# Patient Record
Sex: Female | Born: 1940
Health system: Southern US, Community
[De-identification: ages and names within clinical notes are randomized; demographics above are authoritative.]

## PROBLEM LIST (undated history)

## (undated) ENCOUNTER — Ambulatory Visit (HOSPITAL_COMMUNITY): Discharge status: Absent without leave | Payer: PPO

## (undated) DIAGNOSIS — M1990 Unspecified osteoarthritis, unspecified site: Secondary | ICD-10-CM

## (undated) DIAGNOSIS — M549 Dorsalgia, unspecified: Secondary | ICD-10-CM

## (undated) DIAGNOSIS — F419 Anxiety disorder, unspecified: Secondary | ICD-10-CM

## (undated) DIAGNOSIS — F32A Depression, unspecified: Secondary | ICD-10-CM

## (undated) DIAGNOSIS — H269 Unspecified cataract: Secondary | ICD-10-CM

## (undated) DIAGNOSIS — R51 Headache: Secondary | ICD-10-CM

## (undated) DIAGNOSIS — K219 Gastro-esophageal reflux disease without esophagitis: Secondary | ICD-10-CM

## (undated) DIAGNOSIS — Z8489 Family history of other specified conditions: Secondary | ICD-10-CM

## (undated) DIAGNOSIS — M5481 Occipital neuralgia: Secondary | ICD-10-CM

## (undated) DIAGNOSIS — M8580 Other specified disorders of bone density and structure, unspecified site: Secondary | ICD-10-CM

## (undated) DIAGNOSIS — J309 Allergic rhinitis, unspecified: Secondary | ICD-10-CM

## (undated) DIAGNOSIS — D369 Benign neoplasm, unspecified site: Secondary | ICD-10-CM

## (undated) DIAGNOSIS — F329 Major depressive disorder, single episode, unspecified: Secondary | ICD-10-CM

## (undated) DIAGNOSIS — K8020 Calculus of gallbladder without cholecystitis without obstruction: Secondary | ICD-10-CM

## (undated) DIAGNOSIS — R112 Nausea with vomiting, unspecified: Secondary | ICD-10-CM

## (undated) DIAGNOSIS — K449 Diaphragmatic hernia without obstruction or gangrene: Secondary | ICD-10-CM

## (undated) DIAGNOSIS — G8929 Other chronic pain: Secondary | ICD-10-CM

## (undated) DIAGNOSIS — M797 Fibromyalgia: Secondary | ICD-10-CM

## (undated) DIAGNOSIS — Z9889 Other specified postprocedural states: Secondary | ICD-10-CM

## (undated) DIAGNOSIS — B3781 Candidal esophagitis: Secondary | ICD-10-CM

## (undated) HISTORY — DX: Diaphragmatic hernia without obstruction or gangrene: K44.9

## (undated) HISTORY — PX: CERVICAL SPINE SURGERY: SHX589

## (undated) HISTORY — DX: Unspecified cataract: H26.9

## (undated) HISTORY — PX: ABDOMINAL HYSTERECTOMY: SHX81

## (undated) HISTORY — PX: TEMPORAL ARTERY BIOPSY / LIGATION: SUR132

## (undated) HISTORY — DX: Allergic rhinitis, unspecified: J30.9

## (undated) HISTORY — DX: Other specified disorders of bone density and structure, unspecified site: M85.80

## (undated) HISTORY — DX: Other chronic pain: G89.29

## (undated) HISTORY — DX: Dorsalgia, unspecified: M54.9

## (undated) HISTORY — DX: Occipital neuralgia: M54.81

## (undated) HISTORY — PX: TUBAL LIGATION: SHX77

## (undated) HISTORY — PX: BREAST SURGERY: SHX581

## (undated) HISTORY — DX: Benign neoplasm, unspecified site: D36.9

## (undated) HISTORY — PX: EXCISIONAL HEMORRHOIDECTOMY: SHX1541

## (undated) HISTORY — PX: NECK SURGERY: SHX720

## (undated) HISTORY — PX: BREAST EXCISIONAL BIOPSY: SUR124

## (undated) HISTORY — PX: BACK SURGERY: SHX140

## (undated) HISTORY — PX: DILATION AND CURETTAGE OF UTERUS: SHX78

## (undated) HISTORY — DX: Candidal esophagitis: B37.81

## (undated) HISTORY — PX: OTHER SURGICAL HISTORY: SHX169

## (undated) HISTORY — DX: Calculus of gallbladder without cholecystitis without obstruction: K80.20

---

## 1999-01-19 ENCOUNTER — Ambulatory Visit (HOSPITAL_COMMUNITY): Admission: RE | Admit: 1999-01-19 | Discharge: 1999-01-19 | Payer: Self-pay | Admitting: Internal Medicine

## 1999-01-19 ENCOUNTER — Encounter: Payer: Self-pay | Admitting: Internal Medicine

## 1999-08-25 ENCOUNTER — Ambulatory Visit (HOSPITAL_BASED_OUTPATIENT_CLINIC_OR_DEPARTMENT_OTHER): Admission: RE | Admit: 1999-08-25 | Discharge: 1999-08-25 | Payer: Self-pay

## 1999-08-26 ENCOUNTER — Encounter (INDEPENDENT_AMBULATORY_CARE_PROVIDER_SITE_OTHER): Payer: Self-pay

## 2001-05-08 ENCOUNTER — Inpatient Hospital Stay (HOSPITAL_COMMUNITY): Admission: RE | Admit: 2001-05-08 | Discharge: 2001-05-10 | Payer: Self-pay | Admitting: Obstetrics and Gynecology

## 2001-09-12 ENCOUNTER — Encounter: Payer: Self-pay | Admitting: Obstetrics and Gynecology

## 2001-09-17 ENCOUNTER — Observation Stay (HOSPITAL_COMMUNITY): Admission: RE | Admit: 2001-09-17 | Discharge: 2001-09-18 | Payer: Self-pay | Admitting: Obstetrics and Gynecology

## 2001-09-17 ENCOUNTER — Encounter (INDEPENDENT_AMBULATORY_CARE_PROVIDER_SITE_OTHER): Payer: Self-pay

## 2001-10-03 ENCOUNTER — Ambulatory Visit (HOSPITAL_COMMUNITY): Admission: RE | Admit: 2001-10-03 | Discharge: 2001-10-03 | Payer: Self-pay | Admitting: Obstetrics and Gynecology

## 2001-10-07 ENCOUNTER — Encounter: Payer: Self-pay | Admitting: Obstetrics and Gynecology

## 2001-10-07 ENCOUNTER — Ambulatory Visit (HOSPITAL_COMMUNITY): Admission: RE | Admit: 2001-10-07 | Discharge: 2001-10-07 | Payer: Self-pay | Admitting: Obstetrics and Gynecology

## 2002-11-17 ENCOUNTER — Encounter: Admission: RE | Admit: 2002-11-17 | Discharge: 2003-02-15 | Payer: Self-pay | Admitting: Otolaryngology

## 2002-12-29 ENCOUNTER — Emergency Department (HOSPITAL_COMMUNITY): Admission: EM | Admit: 2002-12-29 | Discharge: 2002-12-29 | Payer: Self-pay | Admitting: Emergency Medicine

## 2002-12-29 ENCOUNTER — Encounter: Payer: Self-pay | Admitting: Emergency Medicine

## 2003-03-31 ENCOUNTER — Encounter: Admission: RE | Admit: 2003-03-31 | Discharge: 2003-03-31 | Payer: Self-pay | Admitting: Internal Medicine

## 2003-05-21 ENCOUNTER — Encounter: Admission: RE | Admit: 2003-05-21 | Discharge: 2003-05-21 | Payer: Self-pay | Admitting: Internal Medicine

## 2003-06-10 ENCOUNTER — Encounter: Admission: RE | Admit: 2003-06-10 | Discharge: 2003-06-10 | Payer: Self-pay | Admitting: Internal Medicine

## 2003-06-25 ENCOUNTER — Observation Stay (HOSPITAL_COMMUNITY): Admission: RE | Admit: 2003-06-25 | Discharge: 2003-06-26 | Payer: Self-pay | Admitting: Obstetrics and Gynecology

## 2003-11-16 ENCOUNTER — Encounter (INDEPENDENT_AMBULATORY_CARE_PROVIDER_SITE_OTHER): Payer: Self-pay | Admitting: Specialist

## 2003-11-16 ENCOUNTER — Observation Stay (HOSPITAL_COMMUNITY): Admission: EM | Admit: 2003-11-16 | Discharge: 2003-11-17 | Payer: Self-pay | Admitting: Urology

## 2003-11-16 ENCOUNTER — Ambulatory Visit (HOSPITAL_BASED_OUTPATIENT_CLINIC_OR_DEPARTMENT_OTHER): Admission: RE | Admit: 2003-11-16 | Discharge: 2003-11-16 | Payer: Self-pay | Admitting: Urology

## 2004-09-19 ENCOUNTER — Encounter: Admission: RE | Admit: 2004-09-19 | Discharge: 2004-09-19 | Payer: Self-pay | Admitting: Internal Medicine

## 2004-09-29 ENCOUNTER — Encounter: Admission: RE | Admit: 2004-09-29 | Discharge: 2004-09-29 | Payer: Self-pay | Admitting: General Surgery

## 2004-10-09 ENCOUNTER — Encounter: Admission: RE | Admit: 2004-10-09 | Discharge: 2004-10-09 | Payer: Self-pay | Admitting: General Surgery

## 2005-04-03 DIAGNOSIS — D369 Benign neoplasm, unspecified site: Secondary | ICD-10-CM

## 2005-04-03 HISTORY — DX: Benign neoplasm, unspecified site: D36.9

## 2005-04-19 ENCOUNTER — Ambulatory Visit (HOSPITAL_BASED_OUTPATIENT_CLINIC_OR_DEPARTMENT_OTHER): Admission: RE | Admit: 2005-04-19 | Discharge: 2005-04-19 | Payer: Self-pay | Admitting: General Surgery

## 2005-04-19 ENCOUNTER — Encounter (INDEPENDENT_AMBULATORY_CARE_PROVIDER_SITE_OTHER): Payer: Self-pay | Admitting: *Deleted

## 2005-05-08 ENCOUNTER — Emergency Department (HOSPITAL_COMMUNITY): Admission: EM | Admit: 2005-05-08 | Discharge: 2005-05-08 | Payer: Self-pay | Admitting: Emergency Medicine

## 2005-05-10 ENCOUNTER — Encounter: Admission: RE | Admit: 2005-05-10 | Discharge: 2005-05-10 | Payer: Self-pay | Admitting: Internal Medicine

## 2005-05-23 ENCOUNTER — Encounter: Admission: RE | Admit: 2005-05-23 | Discharge: 2005-06-15 | Payer: Self-pay | Admitting: Internal Medicine

## 2005-07-12 ENCOUNTER — Ambulatory Visit (HOSPITAL_COMMUNITY): Admission: RE | Admit: 2005-07-12 | Discharge: 2005-07-12 | Payer: Self-pay | Admitting: Neurosurgery

## 2005-07-25 ENCOUNTER — Inpatient Hospital Stay (HOSPITAL_COMMUNITY): Admission: RE | Admit: 2005-07-25 | Discharge: 2005-07-27 | Payer: Self-pay | Admitting: Neurosurgery

## 2005-08-14 ENCOUNTER — Encounter: Admission: RE | Admit: 2005-08-14 | Discharge: 2005-08-14 | Payer: Self-pay | Admitting: Internal Medicine

## 2005-09-25 ENCOUNTER — Encounter (HOSPITAL_COMMUNITY): Admission: RE | Admit: 2005-09-25 | Discharge: 2005-10-25 | Payer: Self-pay | Admitting: Neurosurgery

## 2005-10-06 ENCOUNTER — Ambulatory Visit (HOSPITAL_COMMUNITY): Admission: RE | Admit: 2005-10-06 | Discharge: 2005-10-06 | Payer: Self-pay | Admitting: Internal Medicine

## 2005-10-19 ENCOUNTER — Ambulatory Visit: Payer: Self-pay | Admitting: Internal Medicine

## 2005-10-31 ENCOUNTER — Encounter (HOSPITAL_COMMUNITY): Admission: RE | Admit: 2005-10-31 | Discharge: 2005-11-30 | Payer: Self-pay | Admitting: Neurosurgery

## 2005-11-06 ENCOUNTER — Ambulatory Visit: Payer: Self-pay | Admitting: Internal Medicine

## 2006-01-04 ENCOUNTER — Encounter (INDEPENDENT_AMBULATORY_CARE_PROVIDER_SITE_OTHER): Payer: Self-pay | Admitting: Specialist

## 2006-01-04 ENCOUNTER — Ambulatory Visit: Payer: Self-pay | Admitting: Internal Medicine

## 2006-05-01 ENCOUNTER — Ambulatory Visit (HOSPITAL_COMMUNITY): Admission: RE | Admit: 2006-05-01 | Discharge: 2006-05-01 | Payer: Self-pay | Admitting: Internal Medicine

## 2006-05-24 ENCOUNTER — Ambulatory Visit: Payer: Self-pay | Admitting: Internal Medicine

## 2006-09-20 ENCOUNTER — Encounter: Admission: RE | Admit: 2006-09-20 | Discharge: 2006-09-20 | Payer: Self-pay | Admitting: Obstetrics and Gynecology

## 2007-01-08 ENCOUNTER — Ambulatory Visit: Payer: Self-pay | Admitting: Internal Medicine

## 2007-01-11 ENCOUNTER — Ambulatory Visit (HOSPITAL_COMMUNITY): Admission: RE | Admit: 2007-01-11 | Discharge: 2007-01-11 | Payer: Self-pay | Admitting: Oncology

## 2007-01-29 ENCOUNTER — Encounter: Admission: RE | Admit: 2007-01-29 | Discharge: 2007-01-29 | Payer: Self-pay | Admitting: Neurosurgery

## 2007-06-13 DIAGNOSIS — K208 Other esophagitis without bleeding: Secondary | ICD-10-CM | POA: Insufficient documentation

## 2007-06-13 DIAGNOSIS — B3781 Candidal esophagitis: Secondary | ICD-10-CM | POA: Insufficient documentation

## 2007-06-13 DIAGNOSIS — IMO0001 Reserved for inherently not codable concepts without codable children: Secondary | ICD-10-CM

## 2007-06-13 DIAGNOSIS — K219 Gastro-esophageal reflux disease without esophagitis: Secondary | ICD-10-CM

## 2007-06-13 DIAGNOSIS — K224 Dyskinesia of esophagus: Secondary | ICD-10-CM

## 2007-09-25 ENCOUNTER — Encounter: Admission: RE | Admit: 2007-09-25 | Discharge: 2007-09-25 | Payer: Self-pay | Admitting: Obstetrics and Gynecology

## 2007-10-09 ENCOUNTER — Telehealth: Payer: Self-pay | Admitting: Internal Medicine

## 2007-10-10 DIAGNOSIS — M5090 Cervical disc disorder, unspecified, unspecified cervical region: Secondary | ICD-10-CM | POA: Insufficient documentation

## 2007-10-10 DIAGNOSIS — K589 Irritable bowel syndrome without diarrhea: Secondary | ICD-10-CM

## 2007-10-10 DIAGNOSIS — K222 Esophageal obstruction: Secondary | ICD-10-CM

## 2007-10-10 DIAGNOSIS — D0590 Unspecified type of carcinoma in situ of unspecified breast: Secondary | ICD-10-CM

## 2007-10-10 DIAGNOSIS — K5900 Constipation, unspecified: Secondary | ICD-10-CM | POA: Insufficient documentation

## 2007-10-11 ENCOUNTER — Ambulatory Visit: Payer: Self-pay | Admitting: Internal Medicine

## 2007-10-14 ENCOUNTER — Telehealth: Payer: Self-pay | Admitting: Internal Medicine

## 2007-10-16 ENCOUNTER — Telehealth: Payer: Self-pay | Admitting: Internal Medicine

## 2007-10-18 ENCOUNTER — Telehealth: Payer: Self-pay | Admitting: Internal Medicine

## 2008-04-18 ENCOUNTER — Encounter: Admission: RE | Admit: 2008-04-18 | Discharge: 2008-04-18 | Payer: Self-pay | Admitting: Orthopedic Surgery

## 2008-04-27 ENCOUNTER — Ambulatory Visit (HOSPITAL_COMMUNITY): Admission: RE | Admit: 2008-04-27 | Discharge: 2008-04-27 | Payer: Self-pay | Admitting: Internal Medicine

## 2008-11-05 ENCOUNTER — Encounter: Admission: RE | Admit: 2008-11-05 | Discharge: 2008-11-05 | Payer: Self-pay | Admitting: Internal Medicine

## 2009-03-15 ENCOUNTER — Encounter: Payer: Self-pay | Admitting: Internal Medicine

## 2009-04-02 ENCOUNTER — Emergency Department (HOSPITAL_COMMUNITY): Admission: EM | Admit: 2009-04-02 | Discharge: 2009-04-02 | Payer: Self-pay | Admitting: Emergency Medicine

## 2009-11-13 ENCOUNTER — Encounter: Admission: RE | Admit: 2009-11-13 | Discharge: 2009-11-13 | Payer: Self-pay | Admitting: Internal Medicine

## 2009-11-25 ENCOUNTER — Emergency Department (HOSPITAL_COMMUNITY): Admission: EM | Admit: 2009-11-25 | Discharge: 2009-11-25 | Payer: Self-pay | Admitting: Emergency Medicine

## 2009-12-22 ENCOUNTER — Encounter: Admission: RE | Admit: 2009-12-22 | Discharge: 2009-12-22 | Payer: Self-pay | Admitting: Internal Medicine

## 2010-04-23 ENCOUNTER — Encounter: Payer: Self-pay | Admitting: Internal Medicine

## 2010-04-24 ENCOUNTER — Encounter: Payer: Self-pay | Admitting: Internal Medicine

## 2010-07-04 LAB — BASIC METABOLIC PANEL
BUN: 13 mg/dL (ref 6–23)
Chloride: 102 mEq/L (ref 96–112)
GFR calc non Af Amer: >60 mL/min (ref >60)
Glucose, Bld: 130 mg/dL — ABNORMAL HIGH (ref 70–99)
Potassium: 3.7 mEq/L (ref 3.5–5.1)

## 2010-07-04 LAB — CBC
HCT: 36.7 % (ref 36.0–46.0)
MCV: 91.2 fL (ref 78.0–100.0)
Platelets: 245 10*3/uL (ref 150–400)
RDW: 12.9 % (ref 11.5–15.5)

## 2010-07-04 LAB — DIFFERENTIAL
Basophils Absolute: 0 10*3/uL (ref 0.0–0.1)
Basophils Relative: 0 % (ref 0–1)
Eosinophils Absolute: 0.5 10*3/uL (ref 0.0–0.7)
Eosinophils Relative: 9 % — ABNORMAL HIGH (ref 0–5)

## 2010-07-04 LAB — SEDIMENTATION RATE: Sed Rate: 35 mm/hr — ABNORMAL HIGH (ref 0–22)

## 2010-07-26 ENCOUNTER — Other Ambulatory Visit (HOSPITAL_COMMUNITY): Payer: Self-pay | Admitting: Internal Medicine

## 2010-07-26 DIAGNOSIS — M949 Disorder of cartilage, unspecified: Secondary | ICD-10-CM

## 2010-08-02 ENCOUNTER — Ambulatory Visit (HOSPITAL_COMMUNITY)
Admission: RE | Admit: 2010-08-02 | Discharge: 2010-08-02 | Disposition: A | Discharge status: Home / Self Care | Payer: Medicare Other | Source: Ambulatory Visit | Attending: Internal Medicine | Admitting: Internal Medicine

## 2010-08-02 DIAGNOSIS — Z780 Asymptomatic menopausal state: Secondary | ICD-10-CM | POA: Insufficient documentation

## 2010-08-02 DIAGNOSIS — Z13820 Encounter for screening for osteoporosis: Secondary | ICD-10-CM | POA: Insufficient documentation

## 2010-08-02 DIAGNOSIS — M899 Disorder of bone, unspecified: Secondary | ICD-10-CM | POA: Insufficient documentation

## 2010-08-02 DIAGNOSIS — M949 Disorder of cartilage, unspecified: Secondary | ICD-10-CM | POA: Insufficient documentation

## 2010-08-14 ENCOUNTER — Inpatient Hospital Stay (INDEPENDENT_AMBULATORY_CARE_PROVIDER_SITE_OTHER)
Admission: RE | Admit: 2010-08-14 | Discharge: 2010-08-14 | Disposition: A | Discharge status: Home / Self Care | Payer: Medicare Other | Source: Ambulatory Visit | Attending: Family Medicine | Admitting: Family Medicine

## 2010-08-14 DIAGNOSIS — L259 Unspecified contact dermatitis, unspecified cause: Secondary | ICD-10-CM

## 2010-08-16 NOTE — Assessment & Plan Note (Signed)
[facility]                         GASTROENTEROLOGY OFFICE NOTE   NAME:BENNETTOluwakemi, Salsberry                    MRN:          [id number]  DATE:01/08/2007                            DOB:          24-Nov-1940    Ms Lawhorne is a 70 year old white female with fibromyalgia,  gastroesophageal reflux disease.  She is positive for family history of  colon cancer in a direct relative.  Her last colonoscopy in October 2007  showed no evidence of recurrent polyps.  She had an upper endoscopy also  January 04, 2006 with findings of normal esophagus and candid  esophagitis.  Her dysphagia at this point was attributed to motility  disorder.  She has done well except for recent exacerbation of her  fibromyalgia.  She has been having chest pain, skull pain, arm pains and  general myalgias.  She called approximately 3 weeks ago with substernal  chest pain, which she has attributed to her esophageal spasm.  There is  a history of esophageal spasm on esophagram in 1993 ordered by Dr. Victorino Dike.  It showed esophageal spasm as well as reflux.  There was no  stricture.  She has recently been on Prilosec 20 mg daily with some  improvement of her symptoms.  She was also seen by Dr. Nehemiah Settle for the  chest pain and had tenderness in costochondral area suggesting chest  wall tenderness.   MEDICATIONS:  Lexapro 10 mg at bedtime, Elavil 25 mg at bedtime,  Desoximetasone cream, flaxseed oil and estradiol.   PHYSICAL EXAMINATION:  Blood pressure 122/62, pulse 72 and weight 121  pounds.  Patient was in no distress.  She was somewhat anxious.  LUNGS clear to auscultation.  Her voice was normal but she kept clearing  her throat continuously.  There was no cough.  NECK:  Supple.  LUNGS:  Clear to auscultation.  COR:  With normal S1, normal S2.  No gallops or murmur.  ABDOMEN:  Soft with marked tenderness in the xyphoid area and sub-  xyphoid area and also in the distal sternal bone  and the costochondral  junctions.  Her epigastrium was also mildly uncomfortable.  The rest of  the abdomen was unremarkable.   IMPRESSION:  A 70 year old white female with:  1. Fibromyalgia with multiple complaints pertaining to her      fibromyalgia including chest pain.  2. Gastroesophageal reflux disease with recent exacerbation.  3. History of candid esophagitis.  4. History of esophageal dysmotility.   PLAN:  1. Obtain barium swallow.  Compare with 1993 study.  2. Add Carafate slowly 1 g twice a day.  3. Increase the omeprazole to 40 mg a day.  I have given the patient      Zegerid samples to be taken on a daily basis for at least 3 weeks.     Hedwig Morton. Juanda Chance, MD  Electronically Signed    DMB/MedQ  DD: 01/08/2007  DT: 01/08/2007  Job #: [id number]   cc:   Ladell Pier, M.D.

## 2010-08-18 ENCOUNTER — Inpatient Hospital Stay (INDEPENDENT_AMBULATORY_CARE_PROVIDER_SITE_OTHER)
Admission: RE | Admit: 2010-08-18 | Discharge: 2010-08-18 | Disposition: A | Discharge status: Home / Self Care | Payer: Medicare Other | Source: Ambulatory Visit

## 2010-08-18 DIAGNOSIS — L259 Unspecified contact dermatitis, unspecified cause: Secondary | ICD-10-CM

## 2010-08-19 NOTE — Op Note (Signed)
NAME:  MARCELLA, Valerie Hampton                       ACCOUNT NO.:  [id number]   MEDICAL RECORD NO.:  [id number]                   PATIENT TYPE:  OBV   LOCATION:  1610                                 FACILITY:  [facility]   PHYSICIAN:  Zenaida Niece, M.D.             DATE OF BIRTH:  1940/08/24   DATE OF PROCEDURE:  06/25/2003  DATE OF DISCHARGE:                                 OPERATIVE REPORT   PREOPERATIVE DIAGNOSES:  Recurrent cystocele.   POSTOPERATIVE DIAGNOSES:  Recurrent cystocele.   PROCEDURE:  Anterior and posterior colporrhaphy and colpocleisis.   SURGEON:  Zenaida Niece, M.D.   ASSISTANT:  Malachi Pro. Ambrose Mantle, M.D.   ANESTHESIA:  General endotracheal tube.   ESTIMATED BLOOD LOSS:  150 mL.   FINDINGS:  Grade 2 cystocele and grade 2 perineal defect.   DESCRIPTION OF PROCEDURE:  The patient was taken to the operating room and  placed in the dorsal supine position. General anesthesia was induced and she  was placed in mobile stirrups. The perineum and vagina were then prepped and  draped in the usual sterile fashion and bladder drained with a red rubber  catheter.  A weighted speculum was inserted into the vagina and the vaginal  apex was grasped with Allis clamps. A superficial piece of vaginal mucosa  was removed sharply. The vagina was dissected in the midline from the  vaginal apex to within 1-2 cm of the urethral meatus.  The cystocele was  then mobilized laterally on both sides.  This was done with very minimal  scar tissue encountered.  The cystocele was then reduced with interrupted  sutures of 2-0 Vicryl. This achieved good reduction.  The vaginal mucosa was  removed and bleeding controlled with electrocautery.   Attention was then turned posteriorly.  Allis clamps were used to grasp the  hymenal ring that would allow approximately one finger to enter the vagina.  A piece of vaginal mucosa was removed sharply and the vagina was dissected  in the midline from the  hymenal ring to the vaginal apex.  The vagina was  then mobilized laterally.  Three sutures of 2-0 Vicryl were placed to  maintain rectovaginal strength.  Excess vaginal mucosa was then removed and  bleeding controlled with electrocautery and interrupted 3-0 Vicryl.  The  anterior and posterior vagina segments were then sutured together.  First on  the patient's left side and then on the right side from the apex to the  introitus. This achieved good vaginal closure with adequate tunnels on each  side. The remaining vaginal defect was closed in a vertical fashion with  running locking 2-0 Vicryl. This also closed the introitus including  bulbocavernosus muscles and perineal skin.  This was done in a fashion  similar to repairing an episiotomy. On inspection, this achieved good  vaginal closure and adequate hemostasis.  A Foley catheter was inserted and  clear urine was drained.  The patient  was taken down from stirrups. The patient was awakened in the operating  room, tolerated the procedure well and was taken to the recovery room in  stable condition.  Counts were correct, she was given Ancef 1 g prior to the  procedure, she had PAS hose all throughout the procedure.                                               Zenaida Niece, M.D.    TDM/MEDQ  D:  06/25/2003  T:  06/25/2003  Job:  [id number]

## 2010-08-19 NOTE — Op Note (Signed)
[facility]  Patient:    Valerie, Hampton [facility] Visit Number: [id number] MRN: [id number]          Service Type: SUR Location: 4W [id number] Attending Physician:  Michaele Offer Dictated by:   Zenaida Niece, M.D. Proc. Date: 09/17/01 Admit Date:  09/17/2001                             Operative Report  PREOPERATIVE DIAGNOSES: 1. Urinary difficulty, status post Burch urethropexy. 2. Vaginal vault descensus.  POSTOPERATIVE DIAGNOSES: 1. Urinary difficulty, status post Burch urethropexy. 2. Pelvic and abdominal adhesions. 3. Enterocele.  PROCEDURE:  Laparoscopic adhesiolysis and cutting of Burch sutures and enterocele repair.  SURGEON:  Zenaida Niece, M.D.  ASSISTANT:  Malachi Pro. Ambrose Mantle, M.D.  ANESTHESIA:  General endotracheal tube.  ESTIMATED BLOOD LOSS:  100 cc.  FINDINGS:  Omentum and bowel were adherent to the abdominal and pelvic walls. Both Burch sutures on each side were identified and cut.  She also was found to have a moderate enterocele.  PROCEDURE IN DETAIL:  The patient was taken to the operating room and placed in the dorsal supine position.  General anesthesia was induced, and she was placed in mobile stirrups.  Abdomen, perineum, and vagina were then prepped and draped in the usual sterile fashion and a Foley catheter inserted.  Her infraumbilical skin was then infiltrated with 0.25% Marcaine, and 1.5 cm horizontal incision was made.  The Veress needle was inserted and placement confirmed by the water drop test and an opening pressure of 0 mmHg. Approximately three liters of CO2 gas were insufflated, and the Veress needle was removed.  The 10-11 disposable trocar was then introduced and placement confirmed by the laparoscope.  Multiple adhesions of bowel to the anterior abdominal wall and the pelvis were noted.  Most of these were very filmy. These were taken down bluntly and with sharp dissection with minimal  blood loss.  In order to aid in the remainder of the procedure, a 5 mm port was placed on the right side under direct visualization.  The anterior peritoneum was then incised using cutting current with the scissors.  This was done superior to the bladder.  The bladder was pushed inferiorly, and the areolar tissue was dissected down to the pubic bone.  Initially I unable to identify the Burch sutures on the right side.  After further adhesiolysis of omentum adherent to the anterior abdominal wall on the left side, I was able to identify the Burch sutures on the left side.  These were cut and bleeding controlled with electrocautery.  This side was noted to be hemostatic. Attention was turned back to the right side where the Burch sutures with further dissection were identified and cut.  Both sides were copiously irrigated and found to be adequately hemostatic.  All sites of adhesiolysis were also hemostatic.  The 5 mm trocar was then removed under direct visualization, and all gas was allowed to deflate from the abdomen.  The 10-11 disposable trocar was then removed.  The skin incisions were closed with interrupted subcuticular sutures of 4-0 Vicryl followed by Steri-Strips and band-aids.  Attention was then turned vaginally.  The legs were elevated in stirrups. Upon inspection vaginally, it appeared that the vaginal vault was actually fairly well-supported anteriorly, but there was a posterior defect consistent with an enterocele.  The posterior vagina was grasped approximately half of the way up to the apex.  A piece of vaginal mucosa was removed sharply, and the vaginal mucosa was dissected in the midline from this point to the vaginal apex.  In doing this, an enterocele sac was entered, and fluid left via the laparoscope was removed.  The enterocele sac was then dissected and freed up circumferentially.  A pursestring suture of 0 Vicryl was used to ligate the enterocele sac.  Excess  enterocele sac was then removed sharply.  Some loose areolar tissue was then reduced with interrupted sutures of 2-0 Vicryl. Excess vaginal tissue was then removed, and the vagina was closed in a running locking fashion with 2-0 Vicryl.  This achieved excellent reduction of the enterocele and excellent vaginal support.  The suture line was hemostatic. The patient was taken down from stirrups and extubated in the operating room and taken to the recovery room in stable condition.  She tolerated the procedure well.  She was given Ancef 1 g prior to procedure; all counts were correct, and she did have on PAS hose during the procedure. Dictated by:   Zenaida Niece, M.D. Attending Physician:  Michaele Offer DD:  09/17/01 TD:  09/18/01 Job: [id number]

## 2010-08-19 NOTE — Op Note (Signed)
NAME:  Valerie Hampton, Valerie Hampton                       ACCOUNT NO.:  [id number]   MEDICAL RECORD NO.:  [id number]                   PATIENT TYPE:  AMB   LOCATION:  NESC                                 FACILITY:  [facility]   PHYSICIAN:  Lindaann Slough, M.D.               DATE OF BIRTH:  [birth year]   DATE OF PROCEDURE:  11/16/2003  DATE OF DISCHARGE:                                 OPERATIVE REPORT   PREOPERATIVE DIAGNOSIS:  Bladder stone on a Burch stitch.   POSTOPERATIVE DIAGNOSIS:  Bladder stone on a Burch stitch.   PROCEDURE DONE:  Suprapubic removal of Burch stitch and cystoscopy and  removal of bladder stone.   SURGEON:  Lindaann Slough, M.D.   ANESTHESIA:  General.   INDICATIONS:  The patient is a 70 year old female who had been complaining  of pelvic pain, stress incontinence, and vaginal pain.  She had an anterior  and posterior colporrhaphy with a Burch urethropexy in May 2002.  In April  2003 she had incision of the Burch sutures because of pelvic pain.  She had  continued to have pelvic pain and in March 2005 she had an anterior and  posterior colporrhaphy.  She was seen in the office on November 05, 2003, and  cystoscopy showed that the Burch stitch is in the bladder with a stone on  it.  Treatment options were discussed with the patient, cystoscopy and  removal of the bladder stone and the Burch stitch versus open procedure to  remove the suture.  The patient and her husband have decided to have an open  procedure to remove the whole stitch.  They understand that her pelvic pain  may not be related to the bladder stone and the suture and if the pain  persists after the procedure, we would need to do further workup for  evaluation of the pelvic pain.  They understand and are agreeable.   Under general anesthesia, the patient prepped and draped and placed in the  dorsal lithotomy position.  A #22 Wappler cystoscope was inserted in the  bladder.  There is a large stone attached to a  stitch on the anterior wall  of the bladder, and there is a smaller stone attached to the other end of  the stitch.  The cystoscope was then removed.  A Pfannenstiel incision was  then made in the suprapubic area.  The incision was carried down to the  rectus fascia, which was then incised.  The recti muscles were split in the  midline and the bladder was bluntly and sharply dissected from the rectus  muscle and from the symphysis pubis.  The stitch was then identified after  careful dissection and then secured on a hemostat.  Then the stitch was then  cut.  Then the cystoscope was again inserted in the bladder and both ends of  the stitch were removed with the grasping forceps and the stones were also  extracted.  There was no evidence of remaining stone or suture in the  bladder.  The ureteral orifices are in normal position and shape with clear  efflux.  The bladder was then emptied and the cystoscope removed.  The wound  was then irrigated with normal saline.  There was no evidence of bleeding at  the end of the procedure.  The rectus muscles were then  approximated with #0 Vicryl.  The fascia was closed with #0 PDS and the skin  was closed with 4-0 Monocryl using subcuticular sutures.  Needle, sponge,  and instrument counts were correct on two occasions.   The patient tolerated the procedure well and left the OR in satisfactory  condition to postanesthesia care unit.                                               Lindaann Slough, M.D.    MN/MEDQ  D:  11/16/2003  T:  11/16/2003  Job:  [id number]   cc:   Zenaida Niece, M.D.  [address]  [place]  [place] [address]  Fax: [phone]

## 2010-08-19 NOTE — Discharge Summary (Signed)
NAME:  Valerie Hampton, Valerie Hampton                       ACCOUNT NO.:  [id number]   MEDICAL RECORD NO.:  [id number]                   PATIENT TYPE:  OBV   LOCATION:  0343                                 FACILITY:  [facility]   PHYSICIAN:  Lindaann Slough, M.D.               DATE OF BIRTH:  1940/05/04   DATE OF ADMISSION:  11/16/2003  DATE OF DISCHARGE:  11/17/2003                                 DISCHARGE SUMMARY   DISCHARGE DIAGNOSES:  Bladder stone and BURCH stitch in the bladder.   PROCEDURE:  Cystoscopy, removal of the bladder stone, and suprapubic removal  of BURCH stitch.   HISTORY:  The patient is a 70 year old female who had a BURCH procedure done  in 2002, for stress incontinence.  She also, at that time, had a anterior  and posterior colporrhaphy.  She subsequently had suprapubic pain and had a  laparoscopy in 2003.  At that time, this stitch on the right side was cut.  However, the patient continued to have abdominal pain and had an anterior  and posterior colporrhaphy in March 2005.  She continued to have pain and  was referred to me for photo-evaluation.  Cystoscopy showed a stitch in the  bladder on the right side with bladder stones.  On November 16, 2003, she had  a suprapubic removal of the [facility] stitch and cystoscopy with removal of the  bladder stone.  She was admitted for pain control after the procedure.   PHYSICAL EXAMINATION:  VITAL SIGNS:  Her temperature was 98.4, blood  pressure 112/59, pulse 81, and respirations 20.  LUNGS:  Clear.  HEART:  Regular rhythm.  ABDOMEN:  Soft, tender in the suprapubic area and she had a well-healed  suprapubic scar.  PELVIC:  On pelvic examination, she had some tenderness in the bladder area  and more on the right side.   DISPOSITION:  On August 156, 2005, she was eating well, she was voiding  well.  Her urine was clear.  She had less abdominal pain.  The wound was  clean and dry.  She was then discharged home on Levaquin 250 mg daily,  Percocet 5/325 one tablet q.4h. p.r.n. for pain.  The patient will be seen  in the office in 1 week for followup.  She may also resume all home  medications.  She is instructed not to do any lifting, straining, or driving  until further advised.   DISCHARGE DIET:  Regular.   CONDITION ON DISCHARGE:  Improved.                                               Lindaann Slough, M.D.   MN/MEDQ  D:  11/17/2003  T:  11/17/2003  Job:  [id number]   cc:   Zenaida Niece, M.D.  [address]  [place]  [place] [address]  Fax: [phone]

## 2010-08-19 NOTE — Op Note (Signed)
NAMEMEIYA, WISLER             ACCOUNT NO.:  [id number]   MEDICAL RECORD NO.:  [id number]          PATIENT TYPE:  AMB   LOCATION:  DSC                          FACILITY:  [facility]   PHYSICIAN:  Rose Phi. Maple Hudson, M.D.   DATE OF BIRTH:  July 25, 1940   DATE OF PROCEDURE:  04/19/2005  DATE OF DISCHARGE:                                 OPERATIVE REPORT   PREOPERATIVE DIAGNOSIS:  Intraductal papilloma of the left breast.   POSTOPERATIVE DIAGNOSIS:  Intraductal papilloma of the left breast.   OPERATION:  Excision of duct system of the left breast.   SURGEON:  Rose Phi. Maple Hudson, M.D.   ANESTHESIA:  MAC.   OPERATIVE PROCEDURE:  The patient placed on the operating table with arms  extended on the arm board. She had originally presented with nipple  discharge from the 6 o'clock position and, because of an allergy to the  iodine dyes, ductogram was not done. Mammogram and ultrasound had been  normal.   With the persistent drainage which had changed to a reddish in color, it was  elected to excise it.   In the proper position, the left breast was prepped and draped in the usual  fashion. A circumareolar incision centered at the 6 o'clock position was  then outlined with a marking pencil and the area thoroughly infiltrated with  the local anesthetic mixture. Incision was made and areolar flap dissected  up under the nipple and then the duct system at the 6 o'clock position was  excised and I thought I could actually see the papilloma. Hemostasis  obtained with cautery. Subcuticular closure with 4-0 Monocryl and Steri-  Strips carried out. Dressing applied. The patient transferred to recovery  room in satisfactory condition having tolerated procedure well.      Rose Phi. Maple Hudson, M.D.  Electronically Signed     PRY/MEDQ  D:  04/19/2005  T:  04/19/2005  Job:  [id number]

## 2010-08-19 NOTE — Op Note (Signed)
[name], Valerie Hampton             ACCOUNT NO.:  [id number]   MEDICAL RECORD NO.:  [id number]          PATIENT TYPE:  INP   LOCATION:  3032                         FACILITY:  [facility]   PHYSICIAN:  Hilda Lias, M.D.   DATE OF BIRTH:  1940/05/24   DATE OF PROCEDURE:  07/25/2005  DATE OF DISCHARGE:                                 OPERATIVE REPORT   PREOPERATIVE DIAGNOSIS:  C5-6, C6-7 spondylosis with chronic C6-7  radiculopathy.   POSTOPERATIVE DIAGNOSIS:  C5-6, C6-7 spondylosis with chronic C6-7  radiculopathy.   PROCEDURE:  Anterior C5-6 and C6-7 diskectomy, decompression of the spinal  cord,interbody fusion with alographic autograph plate from N8-2, microscope.   SURGEON:  Hilda Lias, M.D.   ASSISTANT:  Danae Orleans. Venetia Maxon, M.D.   CLINICAL DATA:  The patient has been seen by me for almost a year,  complaining of neck pain of the right upper extremity.  The patient had been  doing fairly well with conservative treatment for the past three months.  She is getting worse.  X-ray shows worsening of the spondylosis, mostly on  the right side.  Patient wants to proceed with surgery.  The risks were  explained  in the history and physical.   PROCEDURE:  The  patient was taken to the OR.  After intubation, the neck  was prepped with Betadine.  A transverse incision was made through the skin,  subcutaneous tissue, platysma, down to cervical spine.  X-ray showed that we  were below 4-5.  Identified 5-6 and 6-7.  There were large osteophytes,  which were removed.  To get into the disk space, we had to drill through the  calcified anterior ligament.  We brought the microscope into the area and  __________  degenerative disk was removed.  We got close to the posterior  ligament and using the 1 mm and 2 mm Kerrison punch as well as the drill, we  removed the osteophytes centrally and laterally.  The calcified posterior  ligament was also excised.  We went laterally and we decompressed the  __________.  The osteophyte, although it was bilateral, was worse on the  right side.  The central __________  C6-7.  At the end, we drilled the end  plates of 5, 6 and 7, and two pieces of allograft with autograft inside were  introduced.  This was followed by a plate using six screws.  Lateral C-spine  showed good position of the graft and the plate.  From then on, the area was  irrigated.  Nevertheless, although the area was dried, the patient had been  taking aspirin in the past, and left behind small heme drain.  The wound was  closed with Vicryl and Steri Strip.           ______________________________  Hilda Lias, M.D.     EB/MEDQ  D:  07/25/2005  T:  07/26/2005  Job:  [id number]

## 2010-08-19 NOTE — H&P (Signed)
NAME:  Valerie Hampton, Valerie Hampton                       ACCOUNT NO.:  [id number]   MEDICAL RECORD NO.:  [id number]                   PATIENT TYPE:  AMB   LOCATION:  DAY                                  FACILITY:  [facility]   PHYSICIAN:  Zenaida Niece, M.D.             DATE OF BIRTH:  [birth year]   DATE OF ADMISSION:  06/25/2003  DATE OF DISCHARGE:                                HISTORY & PHYSICAL   CHIEF COMPLAINT:  Pelvic relaxation.   HISTORY OF PRESENT ILLNESS:  This is a 70 year old white female, para 4-0-3-  4, who has had multiple problems with pelvic relaxation.  In February 2003  she underwent an anterior and posterior colporrhaphy with Burch urethropexy.  She then had some trouble urinating and increasing pelvic pressure.  In June  2003 she underwent a laparoscopy to cut the Burch sutures and then had a  repair of an enterocele done.  She initially did okay from that but recently  has had increasing urinary problems and pelvic pressure.  This also is  associated with increasing back pain.  Pelvic exam reveals a grade 2  perineal defect and a grade 2+ cystocele.  We have tried to treat her with a  pessary, but this was too uncomfortable and she wishes to undergo repeat  surgical repair.   PAST OBSTETRICAL HISTORY:  Four vaginal deliveries without complications and  three spontaneous miscarriages.   PAST MEDICAL HISTORY:  1. Gastroesophageal reflux.  2. Hypertension.  3. Fibromyalgia.  4. Recent headaches, which have been worked up with MRIs and have been felt     to possibly be due to her fibromyalgia.   PAST SURGICAL HISTORY:  1. Breast biopsy.  2. Temporal artery biopsy.  3. Vaginal hysterectomy.  4. Tubal ligation.  5. Laparoscopy followed by laparotomy with BSO and adhesiolysis.  6. Anterior and posterior repair with Burch urethropexy and then laparoscopy     with cutting of Burch sutures and enterocele repair.   ALLERGIES:  IV CONTRAST, LODINE, RELAFEN, NAPROSYN,  ZOLOFT, IBUPROFEN,  BUSPAR, DESIPRAMINE, and BAND-AIDS.   SOCIAL HISTORY:  The patient is not sexually active and does not smoke.   FAMILY HISTORY:  Mother with breast and colon cancer.  Daughter with breast  cancer and a brother with colon cancer.   CURRENT MEDICATIONS:  1. Estradiol 1 mg a day.  2. Azo Standard p.r.n.  3. Valium p.r.n.  4. Nexium daily.   REVIEW OF SYSTEMS:  Otherwise negative.   PHYSICAL EXAMINATION:  GENERAL:  This is a well-developed, well-nourished  white female who is in no acute distress.  VITAL SIGNS:  Weight is 114 pounds.  Blood pressure is 120/78.  NECK:  Supple without lymphadenopathy or thyromegaly.  CHEST:  Lungs clear to auscultation.  CARDIAC:  Regular rate and rhythm without murmur.  ABDOMEN:  Soft, nontender, nondistended, without palpable masses.  She has a  transverse scar and laparoscopic scars.  EXTREMITIES:  No edema, are nontender.  PELVIC:  External genitalia with a grade 2 defect and no lesions.  Speculum  exam reveals a normal cuff with a good 2+ cystocele and no appreciable  rectocele.  On bimanual exam she has no palpable masses.   ASSESSMENT:  Recurrent pelvic relaxation.  She has failed at attempt at  using a pessary and wishes to undergo surgical therapy.  Risks, including  bleeding, infection, and damage to bowel, bladder, and ureters, have been  discussed with the patient and she agrees to proceed.   PLAN:  Admit the patient for anterior and posterior colporrhaphy and a  colpocleisis.                                               Zenaida Niece, M.D.    TDM/MEDQ  D:  06/24/2003  T:  06/24/2003  Job:  [id number]

## 2010-08-19 NOTE — Discharge Summary (Signed)
[facility]  Patient:    Valerie Hampton, TREVATHAN [facility] Visit Number: [id number] MRN: [id number]          Service Type: GYN Location: 4W 0466 01 Attending Physician:  Michaele Offer Dictated by:   Zenaida Niece, M.D. Admit Date:  05/08/2001 Discharge Date: 05/10/2001                             Discharge Summary  ADMISSION DIAGNOSES: 1. Pelvic relaxation. 2. Stress urinary incontinence.  DISCHARGE DIAGNOSES: 1. Pelvic relaxation. 2. Stress urinary incontinence.  PROCEDURES:  Anterior and posterior colporrhaphy and Burch urethropexy and cystoscopy.  COMPLICATIONS:  None.  CONSULTATIONS:  None.  HISTORY OF PRESENT ILLNESS:   Please see charge for full history and physical. Briefly, this is a 70 year old white female with symptomatic cystocele and rectocele and stress urinary incontinence who is admitted for surgical repair.  OBSTETRICAL HISTORY: 1. Four vaginal deliveries. 2. Three spontaneous miscarriages.  PAST MEDICAL HISTORY: 1. Gastroesophageal reflux. 2. Hiatal hernia. 3. Fibromyalgia. 4. Recent arm rash.  PAST SURGICAL HISTORY: 1. Breast biopsy. 2. Temporal artery biopsy. 3. Vaginal hysterectomy. 4. Tubal ligation. 5. Laparoscopy, followed by laparotomy with BSO and adhesiolysis.  ALLERGIES:  IV CONTRAST, LODINE, RELAFEN, NAPROSYN, ZOLOFT, BUSPAR, and she has sensitive skin.  MEDICATIONS: 1. Premarin 0.9 mg q.d. 2. Aciphex p.r.n. 3. Zantac p.r.n.  PHYSICAL EXAMINATION:  Significant for a grade 2 perineal defect, grade 3 cystocele, grade 1 rectocele, and no palpable pelvic masses.  HOSPITAL COURSE:  The patient was admitted on the day of surgery and underwent anterior repair with Burch urethropexy and cystoscopy under general anesthesia without complications.  Estimated blood loss was 100 cc, and she tolerated the procedure very well.  Postoperatively she had some mild nausea which resolved by the afternoon of  postoperative day #1.  At that point she was able to tolerate a regular diet.  She ambulated without difficulty and remained afebrile.  Vaginal packing was removed on postoperative day #1, with moderate stain.  Preoperative hemoglobin 13.2, postoperatively was 12.4.  She was able to void with the aid of a Foley catheter which was left in due to the nature of the surgery.  Her incision was healing well without evidence of infection. On the afternoon of postoperative day #2, again she was ambulating, tolerating a diet, and had good pain control, and was felt to be stable enough for discharge home.  CONDITION ON DISCHARGE:  Stable.  DISPOSITION:  Discharged to home.  DIET:  Regular.  ACTIVITY:  Pelvic rest.  No strenuous activity.  No driving.  FOLLOW-UP:  In three days for staple removal and to check postvoid residual.  DISCHARGE INSTRUCTIONS:  The patient is to remove her Foley catheter at 0800 on May 13, 2001, and see me at 2 oclock that afternoon for staple removal and postvoid residual.  MEDICATIONS: 1. Percocet p.r.n. 2. Macrodantin 100 mg p.o. q.d. while her Foley catheter is in place. 3. Over-the-counter stool softener b.i.d. Dictated by:   Zenaida Niece, M.D. Attending Physician:  Michaele Offer DD:  05/10/01 TD:  05/11/01 Job: [id number] [id number]

## 2010-08-19 NOTE — Assessment & Plan Note (Signed)
[facility]                           GASTROENTEROLOGY OFFICE NOTE   NAME:BENNETTBurnis, Kaser                    MRN:          [id number]  DATE:11/06/2005                            DOB:          Aug 09, 1940    Ms. Calvert is a 70 year old white female with a history of symptomatic  gastroesophageal reflux.  Evaluated last upper endoscopy in July, 2004  showed reflux esophagitis, confirmed on esophageal biopsies.  She at that  time was having chest pain, hoarseness, and clearing of her throat.  She has  also been followed for neoplastic screening of her colon because of a family  history of colon cancer in her brother and her mother.  Last colonoscopy in  November, 2002.  Did not show any evidence of polyps.  She had thickened  folds in the left colon.  Her colon was rather redundant.  She is due for  colonoscopy this year.  In the interim, the patient had repair of a  rectocele and cystocele by Dr. Hilbert Bible in 2003 and subsequent urological  surgery by Dr. Brunilda Payor.  She is still having some problems with evacuation and  constipation, for which she takes Senokot 1-2 tablets at bedtime.  Because  of incomplete coverage of her medications, she is taking only Zantac 300 mg  at bedtime.  She was on some samples of AcipHex, which seemed to control her  reflux better.   PHYSICAL EXAMINATION:  VITAL SIGNS:  Blood pressure 120/60, pulse 92.  Weight 120 pounds.  GENERAL:  She was alert and oriented in no distress.  LUNGS:  Clear to auscultation.  COR:  Normal S1 and S2.  ABDOMEN:  Soft.  Very tender in the right lower quadrant with surgical scar  horizontally in the suprapubic area.  The left lower quadrant was  unremarkable.  The liver edge was at the costal margin.  Upper epigastrium  was normal.  RECTAL:  Normal rectal tone to somewhat increased tone.  Stool was soft,  hemoccult negative.   IMPRESSION:  22.  A 70 year old white female with chest pain and  history of      gastroesophageal reflux disease, currently on estrogen receptor      antagonist.  The pain in the past was related to acid reflux.  There was      no evidence of Barrett's esophagus on esophageal biopsies.  2.  Chronic constipation.  3.  Family history of colon cancer.  Patient due for colonoscopy.   PLAN:  1.  Upper and lower endoscopy scheduled.  2.  Trial of Prilosec over-the-counter or generic 20 mg a day.  I have given      her samples in      place of the Zantac.  3.  Continue stool softeners.                                   Hedwig Morton. Juanda Chance, MD   DMB/MedQ  DD:  11/06/2005  DT:  11/06/2005  Job #:  [id number]   cc:  Ladell Pier, MD

## 2010-08-19 NOTE — H&P (Signed)
Valerie Hampton, Valerie Hampton             ACCOUNT NO.:  [id number]   MEDICAL RECORD NO.:  [id number]          PATIENT TYPE:  INP   LOCATION:  2899                         FACILITY:  [facility]   PHYSICIAN:  Hilda Lias, M.D.   DATE OF BIRTH:  [birth year]   DATE OF ADMISSION:  07/25/2005  DATE OF DISCHARGE:                                HISTORY & PHYSICAL   Valerie Hampton is a lady who had been seen by me for most of the year  complaining of neck pain with radiation to the right upper extremity with  good days and bad days.  At that time, __________ 5-6 and 6-7 and  spondylosis with quite a bit of foraminal narrowing.  Patient improved with  conservative treatment but now she came back to my office 10 days ago  telling me that she was quite miserable.  She was telling me that the pain  was getting worse and she cannot sleep.  Because of the findings, we agreed  with surgery.   ALLERGIES:  She is allergic to PREDNISONE, IBUPROFEN, NAPROSYN, LODINE.   PAST MEDICAL HISTORY:  1.  Hysterectomy.  2.  Bladder surgery.  3.  Tubal ligation.   REVIEW OF SYSTEMS:  Positive for sinus headache, shortness of breath, IBS,  UTI, skin disease.   PHYSICAL EXAMINATION:  GENERAL APPEARANCE:  Patient came in with her  husband.  She was quite miserable.  HEENT:  Normal.  NECK:  She has quite a bit of difficult turning her neck and moving her  neck.  LUNGS:  Clear.  CARDIOVASCULAR:  Heart sounds normal.  ABDOMEN:  Normal.  EXTREMITIES:  Normal pulses.  NEUROLOGIC:  Mental status is normal with normal cranial nerves.  She had  quite a bit of weakness over the biceps and triceps.  Reflexes decreased in  upper extremities.   Cervical spine x-ray and MRI shows severe spondylosis at the level of 5-6  and 6-7 with compromise at the canal as well as __________.   CLINICAL IMPRESSION:  C5-6 and C6-7 spondylosis with a chronic  radiculopathy.   RECOMMENDATIONS:  The patient is being admitted for surgery.  The  procedure  will be anterior cervical discectomy with bone graft and plate.  She knows  the risk of surgery such as infection, CSF leak, damage to vocal cords,  stroke, paralysis, failure of the bone graft, failure of the hardware and  need for another surgery.           ______________________________  Hilda Lias, M.D.     EB/MEDQ  D:  07/25/2005  T:  07/25/2005  Job:  [id number]

## 2010-08-19 NOTE — H&P (Signed)
[facility]  Patient:    Valerie Hampton, Valerie Hampton Visit Number: [id number] MRN: [id number]          Service Type: SUR Location: 4W 0456 01 Attending Physician:  Michaele Offer Dictated by:   Zenaida Niece, M.D. Admit Date:  09/17/2001                           History and Physical  CHIEF COMPLAINT:  Vaginal bulge.  HISTORY OF PRESENT ILLNESS:  This is a 70 year old white female, para 4-0-3-4, who underwent an anterior and posterior colporrhaphy and a Burch urethropexy on February 5 of this year for cystocele, rectocele, and stress urinary incontinence.  However, since that time she has had trouble urinating.  She is emptying okay, but has occasional leak and difficulty starting her stream. This initially got better with decreased leakage.  However, this is now getting worse and when she is almost done urinating, she gets pelvic and low abdominal discomfort and occasionally thinks she is done urinating and stands up and leaks.  She has also had some urgency with leak.  She also complains of a vaginal bulge and on exam does have some prolapse of her vaginal vault.  The patient desires surgical therapy for this and is admitted for laparoscopy to cut the previously placed Burch sutures as well as repair of her vaginal vault prolapse.  PAST OB HISTORY:  Significant for four vaginal deliveries without complications and three spontaneous miscarriages.  PAST MEDICAL HISTORY: 1. Gastroesophageal reflux. 2. Hiatal hernia. 3. Fibromyalgia. 4. Recent bronchitis treated with Z-pack and Advair and now on Augmentin.  PAST SURGICAL HISTORY: 1. Breast biopsy. 2. Temporal artery biopsy. 3. Vaginal hysterectomy. 4. Tubal ligation. 5. Laparoscopy followed by laparotomy with BSO and adhesial lysis. 6. Above mentioned A&P repair. 7. Burch urethropexy.  ALLERGIES:  IV CONTRAST, LODINE, RELAFEN, NAPROSYN, ZOLOFT, BUSPAR, and she also has sensitive  skin.  CURRENT MEDICATIONS: 1. Premarin 0.9 mg p.o. q.d. 2. Flexeril 10 mg p.o. q.h.s. 3. Vioxx 25 mg p.r.n. 4. Aciphex 10 mg p.o. p.r.n. 5. Nasonex spray p.r.n. 6. Steroid cream p.r.n.  SOCIAL HISTORY:  The patient is not currently sexually active and does not smoke.  REVIEW OF SYSTEMS:  Otherwise negative.  FAMILY HISTORY:  Mother with breast and colon cancer and a brother with colon cancer.  PHYSICAL EXAMINATION:  GENERAL:  She is a well-developed, well-nourished white female in no acute distress.  VITAL SIGNS:  Weight is about 115 pounds.  NECK:  Supple without lymphadenopathy or thyromegaly.  LUNGS:  Clear to auscultation.  HEART:  Regular rate and rhythm without murmur.  ABDOMEN:  Soft, nontender, nondistended without palpable masses.  Her incision from her Uvaldo Bristle is well-healed.  EXTREMITIES:  Have no edema, are nontender, and DTRs are 2/4 and symmetric.  PELVIC:  Reveals grade 2 vaginal vault prolapse.  ASSESSMENT:  Symptomatic vaginal vault prolapse and urinary dysfunction which is felt to be possibly due from her Burch urethropexy.  Risks of surgery including bleeding, infection, and damage to the surrounding organs including bowel, bladder, and ureters has been discussed with the patient.  PLAN:  Plan is to admit the patient on June 17 for a laparoscopy with cutting of her Burch sutures followed by repair of her vaginal vault, probably with a colpocleisis. Dictated by:   Zenaida Niece, M.D. Attending Physician:  Michaele Offer DD:  09/16/01 TD:  09/17/01 Job: [id number]

## 2010-08-19 NOTE — Op Note (Signed)
[facility]  Patient:    Valerie Hampton, Valerie Hampton [facility] Visit Number: [id number] MRN: [id number]          Service Type: GYN Location: 4W 0466 01 Attending Physician:  Michaele Offer Dictated by:   Zenaida Niece, M.D. Proc. Date: 05/08/01 Admit Date:  05/08/2001                             Operative Report  PREOPERATIVE DIAGNOSES:  1. Cystocele.  2. Rectocele.  3. Stress urinary incontinence.  POSTOPERATIVE DIAGNOSES:  1. Cystocele.  2. Rectocele.  3. Stress urinary incontinence.  PROCEDURE:  Anterior and posterior colporrhaphy, burch urethropexy and cystoscopy.  SURGEON:  Zenaida Niece, M.D.  ASSISTANT:  Malachi Pro. Ambrose Mantle, M.D.  ANESTHESIA:  General with an endotracheal tube.  ESTIMATED BLOOD LOSS:  100 cc.  FINDINGS:  Grade 3 cystocele, grade 1-2 rectocele, and fair vaginal vault support. With the cystoscope, postoperatively there appeared to be no injury to the bladder.  DESCRIPTION OF PROCEDURE:  The patient was taken to the operating room and placed in the dorsal supine position. General anesthesia was induced and she was placed in mobile stirrups. Her lower abdomen, perineum and vagina were therefore then prepped and draped in the usual sterile fashion and a 16 French Foley with a 30 cc balloon was inserted. The legs were then elevated in the stirrups. The vaginal cuff was grasped with Allis clamps and a small piece of mucosa was removed sharply. The anterior vaginal mucosa was dissected from the apex to within 1 cm of the urethral meatus in the midline using sharp dissection. The bladder was then freed up laterally on both sides using sharp and blunt dissection to mobilize the cystocele. Bleeding was controlled with electrocautery. A small amount of bleeding on the patients right side was also controlled with 3-0 Vicryl. The cystocele was then reduced with interrupted sutures of 2-0 Vicryl which achieved adequate reduction.  Excess vaginal mucosa was then removed sharply and the vaginal mucosa was reapproximated in a running locking fashion with 2-0 Vicryl.  Attention was then turned to the rectocele. The hymenal ring was grasped with Allis clamps at a point that would allow two fingers to fit tightly in the vagina. A piece of mucosa was removed sharply. The posterior vaginal mucosa was dissected from the hymenal ring to the vaginal apex in the midline sharply and bluntly. The rectocele was then mobilized laterally sharply and bluntly with adequate mobilization. Bleeding was controlled with electrocautery. A finger was then used to do a rectal exam and confirm that there was no evidence of an enterocele. The rectocele was then reduced with interrupted sutures of 2-0 Vicryl with adequate reduction. Excess vaginal mucosa was removed and the vagina was closed in a running locking fashion starting at the apex and running to the hymenal ring. This achieved adequate reduction and closure of the rectocele. Bleeding was controlled at several spots along the posterior vaginal mucosa with interrupted sutures of 2-0 and 3-0 Vicryl. Adequate hemostasis was then achieved.  The legs were then lowered and Dr. Ambrose Mantle and I and the remainder of the OR team changed gowns and gloves. We turned our attention to the burch urethropexy. Her abdomen was entered via a standard Pfannenstiel incision through her old scar. This was done through the rectus muscles and through the posterior fascia but not through the peritoneum. The space of Retzius was then entered and dissected bluntly. Bleeding  was controlled with electrocautery and one tie of 3-0 Vicryl. The 30 cc Foley balloon was easily identified. I inserted my left hand into the vagina in order to find good places to place the burch sutures. Two sutures of 2-0 Ethibond were placed on each side lateral to the urethra and close to the bladder to elevate the urethra. Again this was  done on both sides with adequate visualization and adequate hemostasis. The sutures were then passed through the inferior portion of the rectus muscle or Coopers ligament depending what was adequately visualized. The sutures were then loosely tied to support the urethra. Hemostasis was adequate. I removed my hand from the vagina and discarded the overgrowth. The fascia was closed in a running fashion starting at both ends and meeting in the middle with #0 Vicryl. The subcutaneous tissue was made hemostatic with electrocautery and the skin was closed with staples. The legs were then again elevated in stirrups and attention turned vaginally.  The Foley catheter was removed prior to concluding the abdominal closure to make sure that the Foley catheter was not sewn in with the burch urethropexy. It did come out without difficulty. A smaller Foley catheter was inserted and 200 cc of water was instilled into the bladder. The Foley catheter was removed and the bladder was inspected with a 70 degree cystoscope and there was no injury to the bladder found. The urethral opening did appear to be snug but was not at too unusual of an angle and appeared to be adequately supported. The cystoscope was then removed and the Foley catheter reinserted. A vaginal packing was then packed with 2 inch iodoform gauze. A sterile dressing was applied to the abdominal wound. The patient was awakened in the operating room, extubated and taken to the recovery room after tolerating the procedure well. Counts were correct x 2 and she did receive Ancef 1 gm preoperatively. ictated by:   Zenaida Niece, M.D. Attending Physician:  Michaele Offer DD:  05/08/01 TD:  05/09/01 Job: [id number]

## 2010-08-19 NOTE — H&P (Signed)
[facility]  Patient:    Valerie Hampton, Valerie Hampton [facility] Visit Number: [id number] MRN: [id number]          Service Type: GYN Location: 1S X004 01 Attending Physician:  Michaele Offer Dictated by:   Zenaida Niece, M.D. Admit Date:  05/08/2001                           History and Physical  CHIEF COMPLAINT:  Pelvic relaxation.  HISTORY OF PRESENT ILLNESS:  This is a 70 year old white female gravida 7, para 4, 0-3-4, who was seen for an annual examination in May of 2002.  At that time she complained of lots of pelvic pressure with occasional stress incontinence, rectal splinting which was getting worse.  She was taking her Premarin 0.9 mg a day.  Examination at that time revealed a grade 3 cystocele, grade 1 rectocele, and grade 1-2 vaginal vault relaxation.  The patient was advised to continue to her Premarin and was given options to treat her pelvic relaxation.  She elected to proceed with surgery.  She finally agreed to undergo surgery 9 months later which is now.  She is admitted for anterior and posterior repair and Burch urethropexy.  PAST OB HISTORY:  Significant for 4 vaginal deliveries without complications and 3 spontaneous miscarriages for which she had D&Cs.  PAST MEDICAL HISTORY:  Significant for gastroesophageal reflux disease and a hiatal hernia as well as fibromyalgia and a recent rash on her arms.  PAST SURGICAL HISTORY:  Breast biopsy x 2.  Temporal artery biopsy.  Vaginal hysterectomy, tubal ligation, and she also had a laparoscopy followed by laparotomy with BSO, and lysis of adhesions in 1993.  ALLERGIES:  IV CONTRAST.  CURRENT MEDICATIONS: 1. Premarin 0.9 mg 2. She takes aciphex p.r.n. as well as Zantac p.r.n. for her reflux and    hiatal hernia.  SOCIAL HISTORY:  The patient is married but is not sexually active. She denies alcohol, tobacco, or drug use.  FAMILY HISTORY:  She has a mother with breast and colon cancer,  a brother with colon cancer.  REVIEW OF SYSTEMS: Otherwise negative.  PHYSICAL EXAMINATION:  GENERAL:  This is a well-developed well-nourished white female who is in no acute distress.  VITAL SIGNS:  Weight 114 pounds.  Blood pressure 110/80, pulse 62.  HEENT:  Pupils, equal, round, reactive to light and accommodation. Extraocular muscles are intact.  Oropharynx is clear without erythema or exudates.  NECK:  Supple without lymphadenopathy or thyromegaly. LUNGS:  Clear to auscultation.  HEART:  Regular rate and rhythm without murmur.  ABDOMEN:  Soft, nontender, nondistended without palpable masses and she does have scars from laparoscopy and a low transverse abdominal scar.  PELVIC:  External genitalia reveals a grade 2 defect.  Bimanual examination reveals no palpable masses.  Speculum examination reveals a grade 3 cystocele with a grade 1 rectocele and possible grade 1-2 vaginal vault descensus.  ASSESSMENT:  Pelvic relaxation with symptomatic cystocele and rectocele.  The patient does desire to maintain vaginal depth for intercourse.  She also has some stress incontinence which would probably be made worse by a cystocele repair alone. The risks of surgery including bleeding, infection, and damage to surrounding organs including bladder and rectum have been discussed with the patient.  PLAN:  Admit the patient on the day of surgery for anterior and posterior repair as well as a Burch urethropexy.  We initialy discussed sacrospinous vaginal vault suspension but as  the patient does not desire to maintain vaginal depth she will not have this procedure. Dictated by:   Zenaida Niece, M.D. Attending Physician:  Michaele Offer DD:  05/08/01 TD:  05/08/01 Job: [id number]

## 2010-08-19 NOTE — Assessment & Plan Note (Signed)
[facility]                               PULMONARY OFFICE NOTE   NAME:BENNETTArora, Coakley                    MRN:          [id number]  DATE:10/19/2005                            DOB:          [birth year]    HISTORY:  A 70 year old white female with new onset with intermittent  dyspnea first noted three years ago.  Typically intermittent only lasting  several days and always reproduced with exertion.  Presently she has been  having persistent symptoms since July 2, again perfectly reproduced with  exertion and now having trouble getting across the parking lot without her  heart heart racing and pounding, associated with generalized weakness and  chest discomfort but no definite chest pain.  No orthopnea, PND or leg  swelling.  She underwent a echocardiogram for this.  I understand her workup  so far has included an echocardiogram which shows diastolic dysfunction and  a computerized tomography scan of the chest which shows notice of pulmonary  embolism.  The patient denies any pleuritic pain or significant cough,  fevers, chills or sweats.   PAST MEDICAL HISTORY:  Significant for fibromyalgia, remote disc surgery and  hysterectomy.   ALLERGIES:  IVP DYE.   MEDICATIONS:  Senokot, amitriptyline, Lexapro, Tramadol, estradiol and  AcipHex.   SOCIAL HISTORY:  She quit smoking 40 years ago.  She is on disability with  no unusual travel, pet or hobby exposure.   FAMILY HISTORY:  Significant for heart disease in two sisters and one  brother.   REVIEW OF SYSTEMS:  Taken in detail on work sheet.  Significant for the  problems outlined above.  She does have mild dysphagia.  Reports that this  is somewhat better having started on AcipHex.   PHYSICAL EXAMINATION:  GENERAL:  This is an animated anxious white female in  no acute distress.  VITAL SIGNS:  Stable vital signs.  HEENT:  Unremarkable.  Pharynx clear.  NECK:  Supple without cervical adenopathy  or tenderness.  Trachea is  midline.  No thyromegaly.  LUNGS:  Lung fields clear to auscultation and percussion.  HEART:  There is regular rate and rhythm without murmur, gallop or rub.  ABDOMEN:  Soft, benign.  EXTREMITIES:  Without calf tenderness, cyanosis, clubbing or edema.   __________ saturation 98% on room air.   LABORATORY DATA:  Lab data from October 06, 2005 indicates a normal TSH, CBC and  chemistry profile with a bicarb level of 29.  Diastolic relaxation was noted  on echocardiogram dated July 11.   IMPRESSION:  Perfectly reproducible dyspnea on exertion that previously was  intermittent I believe only because her exertion was intermittent,  associated with generalized weakness and palpitations.  No evidence of a  pulmonary etiology.  This is an excellent history to suggest angina and  although the patient has had a previous stress test, she has not had a  stress test via the onset since the crescendo of her symptoms on July 2nd.  I therefore recommended a stress test if the discomfort continues to occur  only with exertion.  If it becomes more  of a pattern with daily activities  then I would consider this crescendo angina to __________ otherwise and  consider admission to the hospital for more expedient workup which might  include a heart catheterization depending on Dr. Bartholomew Crews discretion in  knowing this patient better than I do.   RECOMMENDATIONS:  The specificity of the complaint of exertional chest pain  and dyspnea is not nearly as predictive of heart disease in patients with  fibromyalgia, however, and also in patients with reflux and therefore I  recommend the following:  1.  Empirically start her on Toprol XL 25 mg q.a.m. while awaiting cardiac      evaluation.  2.  Increase AcipHex empirically to 40 mg b.i.d. while awaiting      gastrointestinal evaluation by Dr. Lina Sar.  3.  I also reviewed her dietary measures.  4.  I assured her she had no obvious  pulmonary etiology to explain her      dyspnea and would be happy to see her back here for pulmonary function      tests if there is still question after a cardiac evaluation is complete.                                   Charlaine Dalton. Sherene Sires, MD, [facility]   MBW/MedQ  DD:  10/19/2005  DT:  10/20/2005  Job #:  [id number]   cc:   Ladell Pier, MD

## 2010-08-19 NOTE — Assessment & Plan Note (Signed)
[facility]                         GASTROENTEROLOGY OFFICE NOTE   NAME:BENNETTEthelean, Colla                    MRN:          [id number]  DATE:05/24/2006                            DOB:          1941-04-03    May 24, 2006   Ms. Pizzini is a very nice 70 year old white female with history of  esophageal sphincter, gastroesophageal reflux disease.  Barium swallow  in 2000 showed ineffective peristalsis with spasm.  Recent upper  endoscopy in October 2007, showed no evidence of esophageal stricture.  Large caliber dilator passed without difficulty with only minimal  improvement in patient's dysphagia.  She has intermittent dysphagia to  liquids as well as solids suggesting that her problem is more motility  dysfunction.  Colonoscopy on January 04, 2006, was a normal exam.  She  has a strong family history of colon cancer in several direct relatives.  Patient is complaining of chronic constipation.  She was found to have  candida esophagitis on esophageal biopsies in October and was treated  with Diflucan.  She also takes MiraLax but does not feel it is helping.  Dr. Olena Leatherwood suggested her taking Boniva for osteoporosis but patient had  concern about the cost of it as well as possibility of GI side effects.   PHYSICAL EXAMINATION:  GENERAL APPEARANCE:  She was alert and oriented,  in no distress.  VITAL SIGNS:  Blood pressure 122/68, pulse 100, weight 123 pounds which  represents a 10-pound weight gain since last time.  LUNGS:  Clear to auscultation.  CARDIOVASCULAR:  Normal S1 and normal S2.  ABDOMEN:  Somewhat protuberant but soft, minimally tender across the  upper abdomen and up to right upper quadrant with normal active bowel  sounds.  No palpable mass.  RECTAL:  Exam was normal rectal tone.  Stool was Hemoccult negative.  Patient had previous repair of the enterocele, rectocele and cystocele.   IMPRESSION:  53. A 70 year old white female with  irritable bowel      syndrome/constipation, status post remote repair of rectocele,      enterocele and cystocele.  2. Esophageal spasm causing esophageal dysmotility which may be      relative contraindication to use of Boniva.  3. Functional constipation.  4. Recent history of Candida esophagitis, treated with Diflucan.   PLAN:  1. I suggested mineral oil  for constipation, it is cheap and      effective.  2. Continue Prilosec 20 mg on p.r.n. basis.  3. Consider using , and avoid use of biphosphonates since she had      esophageal motility disorder.     Hedwig Morton. Juanda Chance, MD  Electronically Signed    DMB/MedQ  DD: 05/24/2006  DT: 05/24/2006  Job #: [id number]   cc:   Ladell Pier, M.D.

## 2010-11-15 ENCOUNTER — Other Ambulatory Visit: Payer: Self-pay | Admitting: Internal Medicine

## 2010-11-15 DIAGNOSIS — Z1231 Encounter for screening mammogram for malignant neoplasm of breast: Secondary | ICD-10-CM

## 2010-12-06 ENCOUNTER — Emergency Department (HOSPITAL_COMMUNITY)
Admission: EM | Admit: 2010-12-06 | Discharge: 2010-12-06 | Disposition: A | Discharge status: Home / Self Care | Payer: Medicare Other | Attending: Emergency Medicine | Admitting: Emergency Medicine

## 2010-12-06 ENCOUNTER — Emergency Department (HOSPITAL_COMMUNITY): Payer: Medicare Other

## 2010-12-06 ENCOUNTER — Encounter: Payer: Self-pay | Admitting: *Deleted

## 2010-12-06 DIAGNOSIS — R51 Headache: Secondary | ICD-10-CM

## 2010-12-06 DIAGNOSIS — Z9079 Acquired absence of other genital organ(s): Secondary | ICD-10-CM | POA: Insufficient documentation

## 2010-12-06 DIAGNOSIS — R1110 Vomiting, unspecified: Secondary | ICD-10-CM | POA: Insufficient documentation

## 2010-12-06 DIAGNOSIS — R110 Nausea: Secondary | ICD-10-CM

## 2010-12-06 DIAGNOSIS — IMO0001 Reserved for inherently not codable concepts without codable children: Secondary | ICD-10-CM | POA: Insufficient documentation

## 2010-12-06 HISTORY — DX: Fibromyalgia: M79.7

## 2010-12-06 LAB — CBC
MCHC: 34.5 g/dL (ref 30.0–36.0)
RDW: 12.6 % (ref 11.5–15.5)

## 2010-12-06 LAB — COMPREHENSIVE METABOLIC PANEL
ALT: 14 U/L (ref 0–35)
Albumin: 3.3 g/dL — ABNORMAL LOW (ref 3.5–5.2)
Alkaline Phosphatase: 72 U/L (ref 39–117)
Potassium: 5 mEq/L (ref 3.5–5.1)
Sodium: 141 mEq/L (ref 135–145)
Total Protein: 6.2 g/dL (ref 6.0–8.3)

## 2010-12-06 MED ORDER — ONDANSETRON HCL 4 MG PO TABS
8.0000 mg | ORAL_TABLET | Freq: Three times a day (TID) | ORAL | Status: AC | PRN
Start: 2010-12-06 — End: 2010-12-13

## 2010-12-06 MED ORDER — SODIUM CHLORIDE 0.9 % IV BOLUS (SEPSIS)
1000.0000 mL | Freq: Once | INTRAVENOUS | Status: AC
  Administered 2010-12-06: 1000 mL via INTRAVENOUS

## 2010-12-06 MED ORDER — ONDANSETRON HCL 4 MG/2ML IJ SOLN
4.0000 mg | Freq: Once | INTRAMUSCULAR | Status: AC
  Administered 2010-12-06: 4 mg via INTRAVENOUS
  Filled 2010-12-06: qty 2

## 2010-12-06 NOTE — ED Provider Notes (Addendum)
History  Scribed for Dr. Patria Mane, the patient was seen in room 10. The chart was scribed by Gilman Schmidt. The patients care was started at 2006  CSN: [id number] Arrival date & time: 12/06/2010  7:11 PM  Chief Complaint  Patient presents with  . Headache   Patient is a 70 y.o. female presenting with headaches. The history is provided by the patient and a relative.  Headache  Associated symptoms include nausea.   Valerie Hampton is a 70 y.o. female who presents to the Emergency Department complaining of nausea. Pt reports that she has had five days of ongoing headache in temple that was accompanied with nausea last night. States that tenderness is similar to acid reflux symptoms. Pt reports that headache is exacerbated by movement.  Pt states that there is no present headache and denies any associated symptoms of fever, diarrhea, weakness, recent head trauma, or falls  HPI ELEMENTS:  Onset: five days ago Modifying factors: exacerbated by movement Context:  as above  Associated symptoms: denies any associated symptoms of fever, diarrhea, weakness, recent head trauma, or falls   PAST MEDICAL HISTORY:  Past Medical History  Diagnosis Date  . Fibromyalgia      PAST SURGICAL HISTORY:  Past Surgical History  Procedure Date  . Abdominal hysterectomy   . Neck surgery      MEDICATIONS:  Previous Medications   ACETAMINOPHEN (TYLENOL) 500 MG TABLET    Take 1,000 mg by mouth as needed. For pain    ACETAMINOPHEN-CAFFEINE (TENSION HEADACHE) 500-65 MG TABS    Take 1 tablet by mouth as needed.     CHLORZOXAZONE (PARAFON) 500 MG TABLET    Take 500 mg by mouth 3 (three) times daily as needed. For pain    DOCUSATE SODIUM (COLACE) 100 MG CAPSULE    Take 100 mg by mouth as needed.     HYDROCODONE-ACETAMINOPHEN (NORCO) 10-325 MG PER TABLET    Take 1 tablet by mouth every 6 (six) hours as needed. For pain    OMEPRAZOLE (PRILOSEC) 40 MG CAPSULE    Take 40 mg by mouth daily.       ALLERGIES:    Allergies as of 12/06/2010 - Review Complete 12/06/2010  Allergen Reaction Noted  . Nsaids Other (See Comments)   . Ciprofloxacin Rash 10/10/2007  . Iohexol Rash 12/06/2010     FAMILY HISTORY:  History reviewed. No pertinent family history.   SOCIAL HISTORY: History  Substance Use Topics  . Smoking status: Never Smoker   . Smokeless tobacco: Not on file  . Alcohol Use: No   No sick contact.   Review of Systems  Gastrointestinal: Positive for nausea.  Neurological: Positive for headaches.  All other systems reviewed and are negative.    Physical Exam  BP 131/66  Pulse 84  Temp(Src) 98 F (36.7 C) (Oral)  Resp 20  Ht 4\' 9"  (1.448 m)  Wt 107 lb (48.535 kg)  BMI 23.15 kg/m2  SpO2 97%  Physical Exam  Constitutional: She is oriented to person, place, and time. She appears well-developed and well-nourished.  HENT:  Head: Normocephalic.  Eyes: Pupils are equal, round, and reactive to light.  Neck: Normal range of motion and phonation normal.  Cardiovascular: Normal rate, regular rhythm, normal heart sounds and intact distal pulses.   Pulmonary/Chest: Effort normal and breath sounds normal. She exhibits no bony tenderness.  Abdominal: Soft. Normal appearance. There is no tenderness. There is no rebound and no guarding.  Musculoskeletal: Normal range of motion.  5/5 Strength in muscle groups bilaterally in upper and lower extremities  Facial Symmetry  Neurological: She is alert and oriented to person, place, and time. She has normal strength. No sensory deficit.  Skin: Skin is intact.  Psychiatric: She has a normal mood and affect. Her behavior is normal. Judgment and thought content normal.    OTHER DATA REVIEWED: Nursing notes, vital signs, and past medical records reviewed.   DIAGNOSTIC STUDIES: Oxygen Saturation is 97% on room air, normal by my interpretation.    LABS:  Results for orders placed during the hospital encounter of 12/06/10  CBC       Component Value Range   WBC 8.1  4.0 - 10.5 (K/uL)   RBC 3.89  3.87 - 5.11 (MIL/uL)   Hemoglobin 12.1  12.0 - 15.0 (g/dL)   HCT 16.1 (*) 09.6 - 46.0 (%)   MCV 90.2  78.0 - 100.0 (fL)   MCH 31.1  26.0 - 34.0 (pg)   MCHC 34.5  30.0 - 36.0 (g/dL)   RDW 04.5  40.9 - 81.1 (%)   Platelets 258  150 - 400 (K/uL)  COMPREHENSIVE METABOLIC PANEL      Component Value Range   Sodium 141  135 - 145 (mEq/L)   Potassium 5.0  3.5 - 5.1 (mEq/L)   Chloride 107  96 - 112 (mEq/L)   CO2 28  19 - 32 (mEq/L)   Glucose, Bld 98  70 - 99 (mg/dL)   BUN 14  6 - 23 (mg/dL)   Creatinine, Ser 9.14  0.50 - 1.10 (mg/dL)   Calcium 9.0  8.4 - 78.2 (mg/dL)   Total Protein 6.2  6.0 - 8.3 (g/dL)   Albumin 3.3 (*) 3.5 - 5.2 (g/dL)   AST 15  0 - 37 (U/L)   ALT 14  0 - 35 (U/L)   Alkaline Phosphatase 72  39 - 117 (U/L)   Total Bilirubin 0.3  0.3 - 1.2 (mg/dL)   GFR calc non Af Amer >60  >60 (mL/min)   GFR calc Af Amer >60  >60 (mL/min)     RADIOLOGY: Ct Head Wo Contrast  12/06/2010  *RADIOLOGY REPORT*  Clinical Data: Headache, nausea and vomiting  CT HEAD WITHOUT CONTRAST  Technique:  Contiguous axial images were obtained from the base of the skull through the vertex without contrast.  Comparison: MRI 11/13/2009  Findings: Ventricles are normal.  Mild chronic microvascular ischemia in the white matter.  No acute infarct.  Negative for hemorrhage or mass.  Calvarium is intact.  IMPRESSION: No acute intracranial abnormality.  Original Report Authenticated By: Camelia Phenes, M.D.   I reviewed all labs and imaging completed today. I personally reviewed the images.       MDM:  Nonspecific HA with vomiting. Normal neuro exam. Abdomen benign. HA resolved on arrival to ER. Doubt meningitis. Doubt SAH. Close follow up with pcp. Nausea resolved in ER.   IMPRESSION: Diagnoses that have been ruled out:  Diagnoses that are still under consideration:  Final diagnoses:   ED Course: 2006: Patient evaluated by ED physician,  CT Head, CBC, CMP, IV, and Zofran ordered. 2258: Recheck by Ed physician. Pt reports that symptoms have improved. Pt is advised to f/u with PCP.   PLAN:  Home The patient is to return the emergency department if there is any worsening of symptoms. I have reviewed the discharge instructions with the patient and family.  CONDITION ON DISCHARGE: Stable  MEDICATIONS GIVEN IN THE E.D.  Medications  chlorzoxazone (PARAFON) 500 MG tablet (not administered)  HYDROcodone-acetaminophen (NORCO) 10-325 MG per tablet (not administered)  acetaminophen (TYLENOL) 500 MG tablet (not administered)  Acetaminophen-Caffeine (TENSION HEADACHE) 500-65 MG TABS (not administered)  omeprazole (PRILOSEC) 40 MG capsule (not administered)  docusate sodium (COLACE) 100 MG capsule (not administered)  sodium chloride 0.9 % bolus 1,000 mL (1000 mL Intravenous Given 12/06/10 2045)  ondansetron (ZOFRAN) injection 4 mg (4 mg Intravenous Given 12/06/10 2045)    DISCHARGE MEDICATIONS: New Prescriptions   No medications on file    SCRIBE ATTESTATION: I personally performed the services described in this documentation, which was scribed in my presence. The recorded information has been reviewed and considered. Lyanne Co, MD    Procedures        Lyanne Co, MD 12/06/10 2306  i personally reviewed the CT scan and labs   Lyanne Co, MD 12/06/10 2306

## 2010-12-06 NOTE — ED Notes (Signed)
C/o headache with nausea for 6 days

## 2010-12-19 ENCOUNTER — Other Ambulatory Visit: Payer: Self-pay | Admitting: Dermatology

## 2010-12-28 ENCOUNTER — Ambulatory Visit: Payer: Medicare Other

## 2011-01-26 ENCOUNTER — Ambulatory Visit
Admission: RE | Admit: 2011-01-26 | Discharge: 2011-01-26 | Disposition: A | Discharge status: Home / Self Care | Payer: Medicare Other | Source: Ambulatory Visit | Attending: Internal Medicine | Admitting: Internal Medicine

## 2011-01-26 DIAGNOSIS — Z1231 Encounter for screening mammogram for malignant neoplasm of breast: Secondary | ICD-10-CM

## 2011-03-09 ENCOUNTER — Encounter: Payer: Self-pay | Admitting: Internal Medicine

## 2011-09-14 ENCOUNTER — Other Ambulatory Visit: Payer: Self-pay | Admitting: Internal Medicine

## 2011-09-14 DIAGNOSIS — R109 Unspecified abdominal pain: Secondary | ICD-10-CM

## 2011-09-15 ENCOUNTER — Ambulatory Visit
Admission: RE | Admit: 2011-09-15 | Discharge: 2011-09-15 | Disposition: A | Discharge status: Home / Self Care | Payer: Medicare Other | Source: Ambulatory Visit | Attending: Internal Medicine | Admitting: Internal Medicine

## 2011-09-15 DIAGNOSIS — R109 Unspecified abdominal pain: Secondary | ICD-10-CM

## 2011-12-18 ENCOUNTER — Other Ambulatory Visit: Payer: Self-pay | Admitting: Internal Medicine

## 2011-12-18 DIAGNOSIS — Z1231 Encounter for screening mammogram for malignant neoplasm of breast: Secondary | ICD-10-CM

## 2011-12-22 ENCOUNTER — Encounter: Payer: Self-pay | Admitting: Internal Medicine

## 2012-01-31 ENCOUNTER — Ambulatory Visit
Admission: RE | Admit: 2012-01-31 | Discharge: 2012-01-31 | Disposition: A | Discharge status: Home / Self Care | Payer: Medicare Other | Source: Ambulatory Visit | Attending: Internal Medicine | Admitting: Internal Medicine

## 2012-01-31 DIAGNOSIS — Z1231 Encounter for screening mammogram for malignant neoplasm of breast: Secondary | ICD-10-CM

## 2012-07-10 ENCOUNTER — Telehealth (HOSPITAL_COMMUNITY): Payer: Self-pay | Admitting: *Deleted

## 2012-07-10 NOTE — Telephone Encounter (Signed)
Applied for Prior Authorization for Zyprexa 20 mg BID through [employer] - coverage for only one tablet daily allowed. Notified [facility] pharmacy that PA applied for. Contacted pt and let her know that PA for Zyprexa 20 mg BID has been applied for.

## 2012-07-10 NOTE — Telephone Encounter (Signed)
Message copied by Tonny Bollman on Wed Jul 10, 2012  5:19 PM ------      Message from: Newt Lukes      Created: Wed Jul 10, 2012  3:27 PM      Regarding: MEDICATION      Contact: [phone]       Hello,       Valerie Hampton called stating she can not get her medication ZYPREXA  because the insurance will not pay for.      Andrey Campanile she wanted to speak with you so I placed her message in your VM. She would like to with Jorje Guild, PA ,as well.             Thanks,Angela        ------

## 2012-07-11 ENCOUNTER — Other Ambulatory Visit (HOSPITAL_COMMUNITY): Payer: Self-pay | Admitting: *Deleted

## 2012-07-26 ENCOUNTER — Other Ambulatory Visit (HOSPITAL_COMMUNITY): Payer: Self-pay | Admitting: Internal Medicine

## 2012-07-26 DIAGNOSIS — M8580 Other specified disorders of bone density and structure, unspecified site: Secondary | ICD-10-CM

## 2012-08-05 ENCOUNTER — Ambulatory Visit (HOSPITAL_COMMUNITY): Payer: Medicare Other

## 2012-08-06 ENCOUNTER — Ambulatory Visit (HOSPITAL_COMMUNITY)
Admission: RE | Admit: 2012-08-06 | Discharge: 2012-08-06 | Disposition: A | Discharge status: Home / Self Care | Payer: Medicare Other | Source: Ambulatory Visit | Attending: Internal Medicine | Admitting: Internal Medicine

## 2012-08-06 DIAGNOSIS — Z13820 Encounter for screening for osteoporosis: Secondary | ICD-10-CM | POA: Insufficient documentation

## 2012-08-06 DIAGNOSIS — M899 Disorder of bone, unspecified: Secondary | ICD-10-CM | POA: Insufficient documentation

## 2012-08-06 DIAGNOSIS — M8580 Other specified disorders of bone density and structure, unspecified site: Secondary | ICD-10-CM

## 2012-08-06 DIAGNOSIS — Z780 Asymptomatic menopausal state: Secondary | ICD-10-CM | POA: Insufficient documentation

## 2012-12-23 ENCOUNTER — Other Ambulatory Visit: Payer: Self-pay

## 2012-12-23 DIAGNOSIS — Z1231 Encounter for screening mammogram for malignant neoplasm of breast: Secondary | ICD-10-CM

## 2013-02-05 ENCOUNTER — Ambulatory Visit
Admission: RE | Admit: 2013-02-05 | Discharge: 2013-02-05 | Disposition: A | Discharge status: Home / Self Care | Payer: Medicare HMO | Source: Ambulatory Visit

## 2013-02-05 DIAGNOSIS — Z1231 Encounter for screening mammogram for malignant neoplasm of breast: Secondary | ICD-10-CM

## 2013-03-10 ENCOUNTER — Encounter: Payer: Self-pay | Admitting: Neurology

## 2013-03-11 ENCOUNTER — Ambulatory Visit (INDEPENDENT_AMBULATORY_CARE_PROVIDER_SITE_OTHER): Payer: Medicare Other | Admitting: Neurology

## 2013-03-11 ENCOUNTER — Encounter (INDEPENDENT_AMBULATORY_CARE_PROVIDER_SITE_OTHER): Payer: Self-pay

## 2013-03-11 ENCOUNTER — Encounter: Payer: Self-pay | Admitting: Neurology

## 2013-03-11 VITALS — BP 123/65 | HR 71 | Ht <= 58 in | Wt 109.0 lb

## 2013-03-11 DIAGNOSIS — R51 Headache: Secondary | ICD-10-CM

## 2013-03-11 MED ORDER — GABAPENTIN 300 MG PO CAPS: 300.0000 mg | ORAL_CAPSULE | Freq: Three times a day (TID) | ORAL | Status: DC

## 2013-03-11 NOTE — Progress Notes (Signed)
[facility]  PATIENT: Valerie Hampton DOB: 06/22/1940  HISTORICAL Valerie Hampton is a 72 years old right-handed Caucasian female, accompanied by her husband, return to clinic for followup of right-sided headaches, she is a patient of Dr. Sandria Manly, last clinical visit was February 2014.  She had a past medical history of anxiety, fiber myalgia, gastroesophageal reflux disease, chronic neck, and low back pain, previous fracture to her left hip and pelvis results surgery at age 62  She was a patient of Dr. Sandria Manly since 1980s, initially was evaluated for paresthesia of her feet, leg and hands.  She has been complaining intermittent right temporal side headache seems 2001, she had a right temporal artery biopsy, which was negative for giant cell arteritis, she also described right occipital neuralgia,  pain starting at right neck, shooting to right parietal, centered at right eye, sometimes extending to her right jaw.  She had a history of cervical C5-6, C6 and 7 disectomy for cervical cord decompression by Dr. Jeral Fruit April 2007.  For similar symptoms, she was evaluated by ophthalmologist Dr.Cotter, with a sedimentation rate of 30,  Previous MRI of the brain, in August 2011 showed mild atrophy small vessel disease, MRA of the brain was normal,  MRI of cervical in 2010 showed solid fusion at C5 and 6,  She previously has tried gabapentin, without much benefit, she could not afford Lyrica, Cymbalta cause mood change,  She returns today, complains of 2 weeks history of worsening right temporal pain, shooting along the right lower jaw, also to her right eye, right vertex/parietal region, she denies visual change, there was no lateralized motor or sensory deficit, the pain is somewhat similar, but more severe than her baseline discomfort, constant achy 4 /10   REVIEW OF SYSTEMS: Full 14 system review of systems performed and notable only for neck pain, next dizziness, runny nose, light  sensitivity, eye pain, cold intolerance, heat intolerance, constipation, restless legs, joint pain, back pain, achy muscles, dizziness, tremor, ALLERGIES: Allergies  Allergen Reactions  . Baclofen   . Nsaids Other (See Comments)    Agitates stomach due to acid reflux  . Ciprofloxacin Rash  . Iohexol Rash    HOME MEDICATIONS: Outpatient Prescriptions Prior to Visit  Medication Sig Dispense Refill  . ALPRAZolam (XANAX) 0.25 MG tablet Take 0.25 mg by mouth daily.      Marland Kitchen docusate sodium (COLACE) 100 MG capsule Take 100 mg by mouth as needed.        Marland Kitchen HYDROcodone-acetaminophen (NORCO/VICODIN) 5-325 MG per tablet Take 1 tablet by mouth. Take twice a day as needed.      . loratadine (CLARITIN) 10 MG tablet Take 10 mg by mouth daily.      . Vitamin D, Ergocalciferol, (DRISDOL) 50000 UNITS CAPS capsule Take 50,000 Units by mouth every 7 (seven) days.      Marland Kitchen acetaminophen (TYLENOL) 500 MG tablet Take 1,000 mg by mouth as needed. For pain       . Acetaminophen-Caffeine (TENSION HEADACHE) 500-65 MG TABS Take 1 tablet by mouth as needed.        . chlorzoxazone (PARAFON) 500 MG tablet Take 500 mg by mouth 3 (three) times daily as needed. For pain       . HYDROcodone-acetaminophen (NORCO) 10-325 MG per tablet Take 1 tablet by mouth every 6 (six) hours as needed. For pain       . omeprazole (PRILOSEC) 40 MG capsule Take 40 mg by mouth daily.  No facility-administered medications prior to visit.    PAST MEDICAL HISTORY: Past Medical History  Diagnosis Date  . Fibromyalgia   . Chronic back pain   . Allergic rhinitis   . Dyspnea     chronic negative eval to datev/q, stress test  . Osteopenia   . Intraductal papilloma 2007    no evidence of malignancy  . Occipital neuralgia   . Cataract     PAST SURGICAL HISTORY: Past Surgical History  Procedure Laterality Date  . Abdominal hysterectomy    . Neck surgery    . Cervical spine surgery    . Breast surgery Bilateral   . Tubal ligation     . Bladder tack    . Hip surgery    . Temporal artery biopsy / ligation Right     FAMILY HISTORY: Family History  Problem Relation Age of Onset  . Cancer - Colon Mother   . Cancer Mother     Breast     SOCIAL HISTORY:  History   Social History  . Marital Status: Married    Spouse Name: Dorene Sorrow     Number of Children: 4  . Years of Education: 8 th   Occupational History  .      retired   Social History Main Topics  . Smoking status: Never Smoker   . Smokeless tobacco: Never Used  . Alcohol Use: No  . Drug Use: No  . Sexual Activity: Not on file   Other Topics Concern  . Not on file   Social History Narrative   Patient lives at home with her husband Dorene Sorrow)   Retired.   Education -8 th   Right handed.   Caffeine- None     PHYSICAL EXAM   Filed Vitals:   03/11/13 0816  BP: 123/65  Pulse: 71  Height: 4\' 9"  (1.448 m)  Weight: 109 lb (49.442 kg)    Not recorded    Body mass index is 23.58 kg/(m^2).   Generalized: In no acute distress  Neck: Supple, no carotid bruits   Cardiac: Regular rate rhythm  Pulmonary: Clear to auscultation bilaterally  Musculoskeletal: No deformity  Neurological examination  Mentation: Alert oriented to time, place, history taking, and causual conversation  Cranial nerve II-XII: Pupils were equal round reactive to light extraocular movements were full, Visual field were full on confrontational test. Bilateral fundi were sharp.  Facial sensation and strength were normal. Hearing was intact to finger rubbing bilaterally. Uvula tongue midline.  head turning and shoulder shrug and were normal and symmetric.Tongue protrusion into cheek strength was normal. Bilateral corneal reflex was normal and symmetric   Motor: normal tone, bulk and strength.  Sensory: Intact to fine touch, pinprick, preserved vibratory sensation, and proprioception at toes.  Coordination: Normal finger to nose, heel-to-shin bilaterally there was no  truncal ataxia  Gait: Rising up from seated position without assistance, normal stance, without trunk ataxia, moderate stride, good arm swing, smooth turning, able to perform tiptoe, and heel walking without difficulty.   Romberg signs: Negative  Deep tendon reflexes: Brachioradialis 2/2, biceps 2/2, triceps 2/2, patellar 2/2, Achilles 2/2, plantar responses were flexor bilaterally.   DIAGNOSTIC DATA (LABS, IMAGING, TESTING) - I reviewed patient records, labs, notes, testing and imaging myself where available.  Lab Results  Component Value Date   WBC 8.1 12/06/2010   HGB 12.1 12/06/2010   HCT 35.1* 12/06/2010   MCV 90.2 12/06/2010   PLT 258 12/06/2010      Component Value  Date/Time   NA 141 12/06/2010 2122   K 5.0 12/06/2010 2122   CL 107 12/06/2010 2122   CO2 28 12/06/2010 2122   GLUCOSE 98 12/06/2010 2122   BUN 14 12/06/2010 2122   CREATININE 0.72 12/06/2010 2122   CALCIUM 9.0 12/06/2010 2122   PROT 6.2 12/06/2010 2122   ALBUMIN 3.3* 12/06/2010 2122   AST 15 12/06/2010 2122   ALT 14 12/06/2010 2122   ALKPHOS 72 12/06/2010 2122   BILITOT 0.3 12/06/2010 2122   GFRNONAA >60 12/06/2010 2122   GFRAA >60 12/06/2010 2122    ASSESSMENT AND PLAN   72 years old Caucasian female, with a long-standing history of intermittent right facial pain, right lower jaw pain, recent exacerbation in past 2 weeks, essentially normal neurological examination, previously MRI of the brain only demonstrated mild atrophy, small vessel disease,  1.  Will try Neurontin 300 mg 3 times a day 2.  return to clinic in 3 months with Eber Jones .      Levert Feinstein, M.D. Ph.D.  [facility] [address], [address] [place], [place] [address] [phone]

## 2013-03-12 ENCOUNTER — Telehealth: Payer: Self-pay | Admitting: *Deleted

## 2013-03-12 MED ORDER — GABAPENTIN 300 MG PO CAPS: 300.0000 mg | ORAL_CAPSULE | Freq: Three times a day (TID) | ORAL | Status: DC

## 2013-03-12 NOTE — Telephone Encounter (Signed)
Rx resent to new pharmacy.

## 2013-05-26 ENCOUNTER — Ambulatory Visit: Payer: Medicare Other | Admitting: Nurse Practitioner

## 2013-06-13 ENCOUNTER — Encounter: Payer: Self-pay | Admitting: Nurse Practitioner

## 2013-06-13 ENCOUNTER — Ambulatory Visit (INDEPENDENT_AMBULATORY_CARE_PROVIDER_SITE_OTHER): Payer: Commercial Managed Care - HMO | Admitting: Nurse Practitioner

## 2013-06-13 ENCOUNTER — Encounter (INDEPENDENT_AMBULATORY_CARE_PROVIDER_SITE_OTHER): Payer: Self-pay

## 2013-06-13 VITALS — BP 133/70 | HR 78 | Ht <= 58 in | Wt 110.0 lb

## 2013-06-13 DIAGNOSIS — R51 Headache: Secondary | ICD-10-CM

## 2013-06-13 DIAGNOSIS — R519 Headache, unspecified: Secondary | ICD-10-CM

## 2013-06-13 MED ORDER — CARBAMAZEPINE 200 MG PO TABS: 200.0000 mg | ORAL_TABLET | Freq: Every day | ORAL | Status: DC

## 2013-06-13 NOTE — Progress Notes (Signed)
[facility]  PATIENT: Valerie Hampton DOB: 1941/02/10   REASON FOR VISIT:follow up for facial pain   HISTORY OF PRESENT ILLNESS:Ms Valerie Hampton, 73 year old female returns for followup. She was last seen in this office by Dr. Krista Hampton 03/11/2013. She has a history of right facial pain, trigeminal neuralgia. She was placed on Neurontin when last seen by Dr. Krista Hampton but she only took the medication for one month due to Hampton effects. She had difficulty sleeping on the medication as well as she felt her pain did not improve. She only took 300 mg. She continues to complain of right lower jaw pain , also to her right eye, right vertex/parietal region, she denies visual change, there was no lateralized motor or sensory deficit. The pain is worse with  chewing, drinking fluids particularly cold fluids and brushing her teeth. She returns for reevaluation. The pain occurs on a daily basis but she claims that it only lasts several hours  HISTORY: of right-sided headaches, she is a prior patient of Dr. Erling Hampton, last clinical visit was February 2014.  She had a past medical history of anxiety, fiber myalgia, gastroesophageal reflux disease, chronic neck, and low back pain, previous fracture to her left hip and pelvis results surgery at age 58  She was a patient of Dr. Erling Hampton since 1980s, initially was evaluated for paresthesia of her feet, leg and hands.  She has been complaining intermittent right temporal Hampton headache since 2001, she had a right temporal artery biopsy, which was negative for giant cell arteritis, she also described right occipital neuralgia, pain starting at right neck, shooting to right parietal, centered at right eye, sometimes extending to her right jaw.  She had a history of cervical C5-6, C6 and 7 disectomy for cervical cord decompression by Dr. Joya Hampton April 2007.  For similar symptoms, she was evaluated by ophthalmologist Dr.Cotter, with a sedimentation rate of 30,  Previous MRI of  the brain, in August 2011 showed mild atrophy small vessel disease, MRA of the brain was normal,  MRI of cervical in 2010 showed solid fusion at C5 and 6,  She previously has tried gabapentin, without much benefit, she could not afford Lyrica, Cymbalta cause mood change,  She returns today, complains of 2 weeks history of worsening right temporal pain, shooting along the right lower jaw, also to her right eye, right vertex/parietal region, she denies visual change, there was no lateralized motor or sensory deficit, the pain is somewhat similar, but more severe than her baseline discomfort, constant achy 4 /10   REVIEW OF SYSTEMS: Full 14 system review of systems performed and notable only for those listed, all others are neg:  Constitutional: N/A  Cardiovascular: N/A  Ear/Nose/Throat: neck pain, runny nose Skin: N/A  Eyes: Light sensitivity Respiratory: Cough  Gastroitestinal: N/A  Hematology/Lymphatic: N/A  Endocrine: N/A Musculoskeletal: Back pain  Allergy/Immunology: N/A  Neurological: N/A Psychiatric: Anxiety   ALLERGIES: Allergies  Allergen Reactions  . Baclofen   . Nsaids Other (See Comments)    Agitates stomach due to acid reflux  . Ciprofloxacin Rash  . Iohexol Rash    HOME MEDICATIONS: Outpatient Prescriptions Prior to Visit  Medication Sig Dispense Refill  . ALPRAZolam (XANAX) 0.25 MG tablet Take 0.25 mg by mouth daily.      Marland Kitchen gabapentin (NEURONTIN) 300 MG capsule Take 1 capsule (300 mg total) by mouth 3 (three) times daily.  90 capsule  12  . HYDROcodone-acetaminophen (NORCO/VICODIN) 5-325 MG per tablet Take 1 tablet  by mouth. Take twice a day as needed.      . diazepam (VALIUM) 10 MG tablet Take 10 mg by mouth daily.      Marland Kitchen docusate sodium (COLACE) 100 MG capsule Take 100 mg by mouth as needed.        . loratadine (CLARITIN) 10 MG tablet Take 10 mg by mouth daily.      . Vitamin D, Ergocalciferol, (DRISDOL) 50000 UNITS CAPS capsule Take 50,000 Units by mouth every  7 (seven) days.       No facility-administered medications prior to visit.    PAST MEDICAL HISTORY: Past Medical History  Diagnosis Date  . Fibromyalgia   . Chronic back pain   . Allergic rhinitis   . Dyspnea     chronic negative eval to datev/q, stress test  . Osteopenia   . Intraductal papilloma 2007    no evidence of malignancy  . Occipital neuralgia   . Cataract     PAST SURGICAL HISTORY: Past Surgical History  Procedure Laterality Date  . Abdominal hysterectomy    . Neck surgery    . Cervical spine surgery    . Breast surgery Bilateral   . Tubal ligation    . Bladder tack    . Hip surgery    . Temporal artery biopsy / ligation Right     FAMILY HISTORY: Family History  Problem Relation Age of Onset  . Cancer - Colon Mother   . Cancer Mother     Breast     SOCIAL HISTORY: History   Social History  . Marital Status: Married    Spouse Name: Valerie Hampton     Number of Children: 4  . Years of Education: 8 th   Occupational History  .      retired   Social History Main Topics  . Smoking status: Never Smoker   . Smokeless tobacco: Never Used  . Alcohol Use: No  . Drug Use: No  . Sexual Activity: Not on file   Other Topics Concern  . Not on file   Social History Narrative   Patient lives at home with her husband Valerie Hampton)   Retired.   Education -8 th   Right handed.   Caffeine- None     PHYSICAL EXAM  Filed Vitals:   06/13/13 0901  BP: 133/70  Pulse: 78  Height: 4\' 10"  (1.473 m)  Weight: 110 lb (49.896 kg)   Body mass index is 23 kg/(m^2).  Generalized: Well developed, in no acute distress  Head: normocephalic and atraumatic,. Oropharynx benign  Neck: Supple, no carotid bruits  Cardiac: Regular rate rhythm, no murmur  Musculoskeletal: No deformity   Neurological examination   Mentation: Alert oriented to time, place, history taking. Follows all commands speech and language fluent  Cranial nerve II-XII: Pupils were equal round reactive  to light extraocular movements were full, visual field were full on confrontational test. Facial sensation and strength were normal. hearing was intact to finger rubbing bilaterally. Uvula tongue midline. head turning and shoulder shrug were normal and symmetric.Tongue protrusion into cheek strength was normal. Motor: normal bulk and tone, full strength in the BUE, BLE,  No focal weakness Sensory: normal and symmetric to light touch, pinprick, and  vibration  Coordination: finger-nose-finger, heel-to-shin bilaterally, no dysmetria Reflexes: Brachioradialis 2/2, biceps 2/2, triceps 2/2, patellar 2/2, Achilles 2/2, plantar responses were flexor bilaterally. Gait and Station: Rising up from seated position without assistance, normal stance,  moderate stride, good arm swing, smooth turning,  able to perform tiptoe, and heel walking without difficulty. Tandem gait is steady  DIAGNOSTIC DATA (LABS, IMAGING, TESTING) -     ASSESSMENT AND PLAN  73 y.o. year old female  has a past medical history of Fibromyalgia; Chronic back pain; right facial pain, trigeminal neuralgia here for followup. Patient had Hampton effects to Neurontin  Try Carbamazepine 200mg  daily at bedtime, given information on most common Hampton effects Call in 2 weeks and let me know how this is working F/U in 3 months Dennie Bible, [facility], [facility], [facility] [address], [place], [place] [address] [phone]

## 2013-06-13 NOTE — Patient Instructions (Signed)
Try Carbamazepine 200mg  daily at bedtime Call in 2 weeks and let me know how this is working F/U in 3 months

## 2013-06-17 ENCOUNTER — Telehealth: Payer: Self-pay | Admitting: Nurse Practitioner

## 2013-06-17 MED ORDER — TIZANIDINE HCL 2 MG PO TABS: 2.0000 mg | ORAL_TABLET | Freq: Every day | ORAL | Status: DC

## 2013-06-17 NOTE — Telephone Encounter (Signed)
Patient was seen last Friday for facial nerve damage--patient was prescribed Carbamazepine which is keeping her awake at night and itching around jaw line of neck and arms-- what can she do? Please call patient.Valerie Hampton

## 2013-06-17 NOTE — Telephone Encounter (Signed)
Discussed with Dr. Krista Blue, Patient has tried Neurontin, baclofen, (Lyrica to expensive) Cymbalta mood changes, and most recently carbamazepine with skin rash. Will try tizanidine 2 mg. Called and spoke with patient will call to pharmacy.

## 2013-07-09 ENCOUNTER — Telehealth: Payer: Self-pay | Admitting: Nurse Practitioner

## 2013-07-09 MED ORDER — TIZANIDINE HCL 2 MG PO TABS: 2.0000 mg | ORAL_TABLET | Freq: Two times a day (BID) | ORAL | Status: DC

## 2013-07-09 NOTE — Telephone Encounter (Signed)
States she needs the RX discussed earlier sent to [facility] not [facility] because she gets it free for 90day supply there

## 2013-07-09 NOTE — Telephone Encounter (Signed)
I spoke with the patient.  Says she is not sure if Tizanidine is working fully because she still has continued jaw pain.  She would like to know if the Tizanidine dose could be increased to twice daily, or if she should take a higher dose just once nightly.  Please advise.  Thank you.    She wanted Hoyle Sauer to know the following info: States she saw the orthopedic for hip and back pain, and has been taking Hydroocodone and Xanax from them.  Says she will have an injection in her back for pain, and if that doesn't work, they will schedule MRI.

## 2013-07-09 NOTE — Telephone Encounter (Signed)
Yes it  can be changed to twice daily. I started her on low dose because she says she has sensitivity to most medications and she has been tried on numerous meds for her jaw pain. She needs to make sure the medication does not make her too drowsy

## 2013-07-09 NOTE — Telephone Encounter (Signed)
Rx has been resent to [facility] per patient request

## 2013-07-09 NOTE — Telephone Encounter (Signed)
I spoke with the patient.  Explained she may take med twice daily.  Advised her to use caution due to drowsiness that may occur.  She verbalized understanding.  Rx has been updated and sent to the pharmacy.  She will call us back if anything further is needed.

## 2013-07-09 NOTE — Telephone Encounter (Signed)
Pt called to update NP/CM on the medication tiZANidine (ZANAFLEX) 2 MG tablet that she started on. Pt states it seems to be working some she is still having light pain in the face and other things going on that pt is seeing an orthopedic Dr. For. Pt would like for CM or her nurse to please call her concerning this matter and that she doesn't have a refill on the prescription and she only has a few left. Thanks

## 2013-08-20 ENCOUNTER — Emergency Department (HOSPITAL_COMMUNITY)
Admission: EM | Admit: 2013-08-20 | Discharge: 2013-08-20 | Disposition: A | Discharge status: Home / Self Care | Payer: Medicare HMO | Attending: Emergency Medicine | Admitting: Emergency Medicine

## 2013-08-20 ENCOUNTER — Encounter (HOSPITAL_COMMUNITY): Payer: Self-pay | Admitting: Emergency Medicine

## 2013-08-20 DIAGNOSIS — M5432 Sciatica, left side: Secondary | ICD-10-CM

## 2013-08-20 DIAGNOSIS — G8929 Other chronic pain: Secondary | ICD-10-CM | POA: Insufficient documentation

## 2013-08-20 DIAGNOSIS — Z8709 Personal history of other diseases of the respiratory system: Secondary | ICD-10-CM | POA: Insufficient documentation

## 2013-08-20 DIAGNOSIS — IMO0001 Reserved for inherently not codable concepts without codable children: Secondary | ICD-10-CM | POA: Insufficient documentation

## 2013-08-20 DIAGNOSIS — Z79899 Other long term (current) drug therapy: Secondary | ICD-10-CM | POA: Insufficient documentation

## 2013-08-20 DIAGNOSIS — M549 Dorsalgia, unspecified: Secondary | ICD-10-CM

## 2013-08-20 DIAGNOSIS — Z8742 Personal history of other diseases of the female genital tract: Secondary | ICD-10-CM | POA: Insufficient documentation

## 2013-08-20 DIAGNOSIS — M5430 Sciatica, unspecified side: Secondary | ICD-10-CM | POA: Insufficient documentation

## 2013-08-20 DIAGNOSIS — Z8669 Personal history of other diseases of the nervous system and sense organs: Secondary | ICD-10-CM | POA: Insufficient documentation

## 2013-08-20 DIAGNOSIS — R209 Unspecified disturbances of skin sensation: Secondary | ICD-10-CM | POA: Insufficient documentation

## 2013-08-20 MED ORDER — CYCLOBENZAPRINE HCL 5 MG PO TABS: 5.0000 mg | ORAL_TABLET | Freq: Three times a day (TID) | ORAL | Status: DC | PRN

## 2013-08-20 MED ORDER — DEXAMETHASONE SODIUM PHOSPHATE 4 MG/ML IJ SOLN
8.0000 mg | Freq: Once | INTRAMUSCULAR | Status: AC
  Administered 2013-08-20: 8 mg via INTRAMUSCULAR
  Filled 2013-08-20: qty 2

## 2013-08-20 MED ORDER — DIAZEPAM 5 MG/ML IJ SOLN
5.0000 mg | Freq: Once | INTRAMUSCULAR | Status: AC
Start: 2013-08-20 — End: 2013-08-20
  Administered 2013-08-20: 5 mg via INTRAMUSCULAR
  Filled 2013-08-20: qty 2

## 2013-08-20 MED ORDER — PREDNISONE 20 MG PO TABS: ORAL_TABLET | ORAL | Status: DC

## 2013-08-20 MED ORDER — OXYCODONE-ACETAMINOPHEN 5-325 MG PO TABS
1.0000 | ORAL_TABLET | Freq: Four times a day (QID) | ORAL | Status: DC | PRN
Start: 2013-08-20 — End: 2013-09-05

## 2013-08-20 MED ORDER — FENTANYL CITRATE 0.05 MG/ML IJ SOLN
50.0000 ug | Freq: Once | INTRAMUSCULAR | Status: AC
Start: 2013-08-20 — End: 2013-08-20
  Administered 2013-08-20: 50 ug via INTRAMUSCULAR
  Filled 2013-08-20: qty 2

## 2013-08-20 NOTE — ED Provider Notes (Signed)
CSN: [id number]     Arrival date & time 08/20/13  1413 History  This chart was scribed for Valerie Norrie, MD by Ludger Nutting, ED Scribe. This patient was seen in room APA03/APA03 and the patient's care was started 3:11 PM.    Chief Complaint  Patient presents with  . Back Pain      The history is provided by the patient. No language interpreter was used.    HPI Comments: Valerie Hampton is a 73 y.o. female with past medical history of chronic back pain, fibromyalgia, occipital neuralgia who presents to the Emergency Department complaining of constant left buttock pain that radiates through the hip and down through the entire left lateral lower leg to her ankle with an aching in her foot beginning last night. Patient states the back pain initially seemed similar to her prior episodes, but it has never radiated into her leg before.  Patient states she has received injections for her back pain from Dr Nelva Bush; last injection was in April 2015. She also had them in Oct and Dec. She denies recent injury or increased activity but states she was gardening 2 days ago. She states she was mixing soft dirt together for a short while. She is able to walk without assistance at baseline. She denies bowel or bladder incontinence, extremity numbness, inability to ambulate. Although she has Vicodin for pain she only took it yesterday morning at 8:30 AM and last night at 9 PM and none today.  PCP Dr Delfina Redwood  Past Medical History  Diagnosis Date  . Fibromyalgia   . Chronic back pain   . Allergic rhinitis   . Dyspnea     chronic negative eval to datev/q, stress test  . Osteopenia   . Intraductal papilloma 2007    no evidence of malignancy  . Occipital neuralgia   . Cataract    Past Surgical History  Procedure Laterality Date  . Abdominal hysterectomy    . Neck surgery    . Cervical spine surgery    . Breast surgery Bilateral   . Tubal ligation    . Bladder tack    . Temporal artery biopsy / ligation  Right    Family History  Problem Relation Age of Onset  . Cancer - Colon Mother   . Cancer Mother     Breast    History  Substance Use Topics  . Smoking status: Never Smoker   . Smokeless tobacco: Never Used  . Alcohol Use: No   Lives at home Lives with spouse  OB History   Grav Para Term Preterm Abortions TAB SAB Ect Mult Living   7 4 4  3  3   3      Review of Systems  Musculoskeletal: Positive for back pain. Negative for gait problem.  Neurological: Negative for numbness.       +Paresthesias to bilateral feet   All other systems reviewed and are negative.     Allergies  Baclofen; Nsaids; Ciprofloxacin; and Iohexol  Home Medications   Prior to Admission medications   Medication Sig Start Date End Date Taking? Authorizing Provider  ALPRAZolam (XANAX) 0.25 MG tablet Take 0.25 mg by mouth daily.   Yes Historical Provider, MD  gabapentin (NEURONTIN) 300 MG capsule Take 300 mg by mouth daily as needed.   Yes Historical Provider, MD  HYDROcodone-acetaminophen (NORCO/VICODIN) 5-325 MG per tablet Take 1 tablet by mouth 2 (two) times daily as needed for moderate pain. Take twice a day as  needed.   Yes Historical Provider, MD   BP 121/62  Pulse 71  Temp(Src) 98.2 F (36.8 C) (Oral)  Resp 18  Ht 4\' 9"  (1.448 m)  Wt 110 lb (49.896 kg)  BMI 23.80 kg/m2  SpO2 99%  Vital signs normal   Physical Exam  Nursing note and vitals reviewed. Constitutional: She is oriented to person, place, and time. She appears well-developed and well-nourished.  Non-toxic appearance. She does not appear ill. She appears distressed.  Patient is rolling around on the stretcher trying to get a comfortable position  HENT:  Head: Normocephalic and atraumatic.  Right Ear: External ear normal.  Left Ear: External ear normal.  Nose: Nose normal. No mucosal edema or rhinorrhea.  Mouth/Throat: Oropharynx is clear and moist and mucous membranes are normal. No dental abscesses or uvula swelling.   Eyes: Conjunctivae and EOM are normal. Pupils are equal, round, and reactive to light.  Neck: Normal range of motion and full passive range of motion without pain. Neck supple.  Cardiovascular: Normal rate, regular rhythm and normal heart sounds.  Exam reveals no gallop and no friction rub.   No murmur heard. Pulmonary/Chest: Effort normal and breath sounds normal. No respiratory distress. She has no wheezes. She has no rhonchi. She has no rales. She exhibits no crepitus.  Abdominal: Soft. Normal appearance and bowel sounds are normal. She exhibits no distension.  Musculoskeletal: Normal range of motion. She exhibits tenderness. She exhibits no edema.       Back:  Non tender thoracic and lumbar spine. Non tender SI joint. Tenderness noted in the left sciatic notch. + SLR on left , patellar reflexes +2 and =   Neurological: She is alert and oriented to person, place, and time. She has normal strength. No cranial nerve deficit.  Reflex Scores:      Patellar reflexes are 2+ on the right side and 2+ on the left side. Skin: Skin is warm, dry and intact. No rash noted. No erythema. No pallor.  Psychiatric: She has a normal mood and affect. Her speech is normal and behavior is normal. Her mood appears not anxious.    ED Course  Procedures (including critical care time)  Medications  fentaNYL (SUBLIMAZE) injection 50 mcg (50 mcg Intramuscular Given 08/20/13 1532)  diazepam (VALIUM) injection 5 mg (5 mg Intramuscular Given 08/20/13 1533)  dexamethasone (DECADRON) injection 8 mg (8 mg Intramuscular Given 08/20/13 1533)     DIAGNOSTIC STUDIES: Oxygen Saturation is 100% on RA, normal by my interpretation.    COORDINATION OF CARE: 3:10 PM Discussed treatment plan with pt at bedside and pt agreed to plan.  At discharge Patient now states her pain did not get worse yesterday it was the day before. She also states that pain started radiating into her left leg yesterday not today.  At discharge  patient lying quietly in bed. She states if she moves she still has pain however she appears much more comfortable than she was when she was first seen. She is going to followup with Dr. Nelva Bush. She states she called his office today and is waiting to hear from him.   Labs Review Labs Reviewed - No data to display  Imaging Review No results found.   EKG Interpretation None      MDM   Final diagnoses:  Back pain  Sciatica of left side   Discharge Medication List as of 08/20/2013  5:47 PM    START taking these medications   Details  cyclobenzaprine (FLEXERIL)  5 MG tablet Take 1 tablet (5 mg total) by mouth 3 (three) times daily as needed for muscle spasms., Starting 08/20/2013, Until Discontinued, Print    oxyCODONE-acetaminophen (PERCOCET/ROXICET) 5-325 MG per tablet Take 1 tablet by mouth every 6 (six) hours as needed for moderate pain or severe pain., Starting 08/20/2013, Until Discontinued, Print    predniSONE (DELTASONE) 20 MG tablet Take 3 po QD x 2d starting tomorrow, then 2 po QD x 3d then 1 po QD x 3d, Print        Plan discharge  Rolland Porter, MD, FACEP   I personally performed the services described in this documentation, which was scribed in my presence. The recorded information has been reviewed and considered.  Rolland Porter, MD, Abram Sander   Valerie Norrie, MD 08/20/13 [phone]

## 2013-08-20 NOTE — Discharge Instructions (Signed)
Try ice and heat to the area to see if that helps with your pain. Take the medications as prescribed. Call Dr Nelva Bush to have him recheck you hopefully this week in the office. Return to the ED if you are unable to control your urine or bm's or you seem worse.  Sciatica Sciatica is pain, weakness, numbness, or tingling along the path of the sciatic nerve. The nerve starts in the lower back and runs down the back of each leg. The nerve controls the muscles in the lower leg and in the back of the knee, while also providing sensation to the back of the thigh, lower leg, and the sole of your foot. Sciatica is a symptom of another medical condition. For instance, nerve damage or certain conditions, such as a herniated disk or bone spur on the spine, pinch or put pressure on the sciatic nerve. This causes the pain, weakness, or other sensations normally associated with sciatica. Generally, sciatica only affects one side of the body. CAUSES   Herniated or slipped disc.  Degenerative disk disease.  A pain disorder involving the narrow muscle in the buttocks (piriformis syndrome).  Pelvic injury or fracture.  Pregnancy.  Tumor (rare). SYMPTOMS  Symptoms can vary from mild to very severe. The symptoms usually travel from the low back to the buttocks and down the back of the leg. Symptoms can include:  Mild tingling or dull aches in the lower back, leg, or hip.  Numbness in the back of the calf or sole of the foot.  Burning sensations in the lower back, leg, or hip.  Sharp pains in the lower back, leg, or hip.  Leg weakness.  Severe back pain inhibiting movement. These symptoms may get worse with coughing, sneezing, laughing, or prolonged sitting or standing. Also, being overweight may worsen symptoms. DIAGNOSIS  Your caregiver will perform a physical exam to look for common symptoms of sciatica. He or she may ask you to do certain movements or activities that would trigger sciatic nerve pain.  Other tests may be performed to find the cause of the sciatica. These may include:  Blood tests.  X-rays.  Imaging tests, such as an MRI or CT scan. TREATMENT  Treatment is directed at the cause of the sciatic pain. Sometimes, treatment is not necessary and the pain and discomfort goes away on its own. If treatment is needed, your caregiver may suggest:  Over-the-counter medicines to relieve pain.  Prescription medicines, such as anti-inflammatory medicine, muscle relaxants, or narcotics.  Applying heat or ice to the painful area.  Steroid injections to lessen pain, irritation, and inflammation around the nerve.  Reducing activity during periods of pain.  Exercising and stretching to strengthen your abdomen and improve flexibility of your spine. Your caregiver may suggest losing weight if the extra weight makes the back pain worse.  Physical therapy.  Surgery to eliminate what is pressing or pinching the nerve, such as a bone spur or part of a herniated disk. HOME CARE INSTRUCTIONS   Only take over-the-counter or prescription medicines for pain or discomfort as directed by your caregiver.  Apply ice to the affected area for 20 minutes, 3 4 times a day for the first 48 72 hours. Then try heat in the same way.  Exercise, stretch, or perform your usual activities if these do not aggravate your pain.  Attend physical therapy sessions as directed by your caregiver.  Keep all follow-up appointments as directed by your caregiver.  Do not wear high heels  or shoes that do not provide proper support.  Check your mattress to see if it is too soft. A firm mattress may lessen your pain and discomfort. SEEK IMMEDIATE MEDICAL CARE IF:   You lose control of your bowel or bladder (incontinence).  You have increasing weakness in the lower back, pelvis, buttocks, or legs.  You have redness or swelling of your back.  You have a burning sensation when you urinate.  You have pain that  gets worse when you lie down or awakens you at night.  Your pain is worse than you have experienced in the past.  Your pain is lasting longer than 4 weeks.  You are suddenly losing weight without reason. MAKE SURE YOU:  Understand these instructions.  Will watch your condition.  Will get help right away if you are not doing well or get worse. Document Released: 03/14/2001 Document Revised: 09/19/2011 Document Reviewed: 07/30/2011 [facility] Patient Information 2014 [place].  Radicular Pain Radicular pain in either the arm or leg is usually from a bulging or herniated disk in the spine. A piece of the herniated disk may press against the nerves as the nerves exit the spine. This causes pain which is felt at the tips of the nerves down the arm or leg. Other causes of radicular pain may include:  Fractures.  Heart disease.  Cancer.  An abnormal and usually degenerative state of the nervous system or nerves (neuropathy). Diagnosis may require CT or MRI scanning to determine the primary cause.  Nerves that start at the neck (nerve roots) may cause radicular pain in the outer shoulder and arm. It can spread down to the thumb and fingers. The symptoms vary depending on which nerve root has been affected. In most cases radicular pain improves with conservative treatment. Neck problems may require physical therapy, a neck collar, or cervical traction. Treatment may take many weeks, and surgery may be considered if the symptoms do not improve.  Conservative treatment is also recommended for sciatica. Sciatica causes pain to radiate from the lower back or buttock area down the leg into the foot. Often there is a history of back problems. Most patients with sciatica are better after 2 to 4 weeks of rest and other supportive care. Short term bed rest can reduce the disk pressure considerably. Sitting, however, is not a good position since this increases the pressure on the disk. You should  avoid bending, lifting, and all other activities which make the problem worse. Traction can be used in severe cases. Surgery is usually reserved for patients who do not improve within the first months of treatment. Only take over-the-counter or prescription medicines for pain, discomfort, or fever as directed by your caregiver. Narcotics and muscle relaxants may help by relieving more severe pain and spasm and by providing mild sedation. Cold or massage can give significant relief. Spinal manipulation is not recommended. It can increase the degree of disc protrusion. Epidural steroid injections are often effective treatment for radicular pain. These injections deliver medicine to the spinal nerve in the space between the protective covering of the spinal cord and back bones (vertebrae). Your caregiver can give you more information about steroid injections. These injections are most effective when given within two weeks of the onset of pain.  You should see your caregiver for follow up care as recommended. A program for neck and back injury rehabilitation with stretching and strengthening exercises is an important part of management.  SEEK IMMEDIATE MEDICAL CARE IF:  You  develop increased pain, weakness, or numbness in your arm or leg.  You develop difficulty with bladder or bowel control.  You develop abdominal pain. Document Released: 04/27/2004 Document Revised: 06/12/2011 Document Reviewed: 07/13/2008 [facility] Patient Information 2014 [place], [place].

## 2013-08-20 NOTE — ED Notes (Signed)
Patient c/o lower back pain that radiates into left leg/calf. Per patient no known injury. Patient reports that pain is constant regardless if laying, sitting, or standing.

## 2013-09-05 ENCOUNTER — Encounter (HOSPITAL_COMMUNITY): Payer: Self-pay | Admitting: Emergency Medicine

## 2013-09-05 ENCOUNTER — Inpatient Hospital Stay (HOSPITAL_COMMUNITY)
Admission: EM | Admit: 2013-09-05 | Discharge: 2013-09-07 | DRG: 552 | Disposition: A | Discharge status: Home / Self Care | Payer: Medicare HMO | Attending: Orthopedic Surgery | Admitting: Orthopedic Surgery

## 2013-09-05 DIAGNOSIS — F3289 Other specified depressive episodes: Secondary | ICD-10-CM | POA: Diagnosis present

## 2013-09-05 DIAGNOSIS — Z888 Allergy status to other drugs, medicaments and biological substances status: Secondary | ICD-10-CM

## 2013-09-05 DIAGNOSIS — Z881 Allergy status to other antibiotic agents status: Secondary | ICD-10-CM

## 2013-09-05 DIAGNOSIS — Z9851 Tubal ligation status: Secondary | ICD-10-CM

## 2013-09-05 DIAGNOSIS — M48061 Spinal stenosis, lumbar region without neurogenic claudication: Principal | ICD-10-CM | POA: Diagnosis present

## 2013-09-05 DIAGNOSIS — K219 Gastro-esophageal reflux disease without esophagitis: Secondary | ICD-10-CM | POA: Diagnosis present

## 2013-09-05 DIAGNOSIS — G8929 Other chronic pain: Secondary | ICD-10-CM | POA: Diagnosis present

## 2013-09-05 DIAGNOSIS — Z79899 Other long term (current) drug therapy: Secondary | ICD-10-CM

## 2013-09-05 DIAGNOSIS — F329 Major depressive disorder, single episode, unspecified: Secondary | ICD-10-CM | POA: Diagnosis present

## 2013-09-05 DIAGNOSIS — M549 Dorsalgia, unspecified: Secondary | ICD-10-CM

## 2013-09-05 DIAGNOSIS — Z885 Allergy status to narcotic agent status: Secondary | ICD-10-CM

## 2013-09-05 DIAGNOSIS — F411 Generalized anxiety disorder: Secondary | ICD-10-CM | POA: Diagnosis present

## 2013-09-05 DIAGNOSIS — M216X9 Other acquired deformities of unspecified foot: Secondary | ICD-10-CM | POA: Diagnosis present

## 2013-09-05 DIAGNOSIS — Z886 Allergy status to analgesic agent status: Secondary | ICD-10-CM

## 2013-09-05 MED ORDER — FENTANYL CITRATE 0.05 MG/ML IJ SOLN
50.0000 ug | Freq: Once | INTRAMUSCULAR | Status: AC
  Administered 2013-09-06: 50 ug via INTRAVENOUS
  Filled 2013-09-05: qty 2

## 2013-09-05 MED ORDER — HYDROMORPHONE HCL PF 1 MG/ML IJ SOLN
1.0000 mg | Freq: Once | INTRAMUSCULAR | Status: AC
  Administered 2013-09-05: 1 mg via INTRAVENOUS
  Filled 2013-09-05: qty 1

## 2013-09-05 MED ORDER — ONDANSETRON HCL 4 MG/2ML IJ SOLN
4.0000 mg | Freq: Once | INTRAMUSCULAR | Status: AC
  Administered 2013-09-05: 4 mg via INTRAVENOUS
  Filled 2013-09-05: qty 2

## 2013-09-05 MED ORDER — DEXAMETHASONE SODIUM PHOSPHATE 10 MG/ML IJ SOLN
10.0000 mg | Freq: Once | INTRAMUSCULAR | Status: AC
Start: 2013-09-05 — End: 2013-09-05
  Administered 2013-09-05: 10 mg via INTRAVENOUS
  Filled 2013-09-05: qty 1

## 2013-09-05 NOTE — ED Provider Notes (Signed)
Pt seen and evaluated face to face.  Care discussed with R. Browning PA. Pt with + MRI.  S/P ESI 48 hours ago.  Seen by her ortho yesterday.  Pt states "planning surgery".  C/O pain and demonstrates L4-5 pattern of radicular pain.  Examined supine. No gross motor deficits.  DTR to achilles, and knee jerk 2+ and symmetric.  Plan is pain control, steroids.  Will discuss with pts ortho as she spoke with on call physician.  No bjective signs of acute neurological deficit. No incontinence.  Tanna Furry, MD 09/05/13 2351

## 2013-09-05 NOTE — ED Provider Notes (Signed)
CSN: [id number]     Arrival date & time 09/05/13  2050 History   First MD Initiated Contact with Patient 09/05/13 2135     Chief Complaint  Patient presents with  . Back Pain     (Consider location/radiation/quality/duration/timing/severity/associated sxs/prior Treatment) HPI Comments: Patient presents to the emergency department with chief complaint of back pain. She states the back pain has been chronic. However, she states that she is having recently new left leg pain. She has been seen by orthopedics, and has had an MRI, and it is advised that she needs to have surgery. She would like to be admitted until she can have the surgery. She states the pain is severe. She is tried taking hydrocodone with some relief. She is able to ambulate. She denies any bowel or bladder incontinence. Denies any saddle anesthesia. Denies any fevers or chills. She has also had injections by Dr. Nelva Bush.  The history is provided by the patient. No language interpreter was used.    Past Medical History  Diagnosis Date  . Fibromyalgia   . Chronic back pain   . Allergic rhinitis   . Dyspnea     chronic negative eval to datev/q, stress test  . Osteopenia   . Intraductal papilloma 2007    no evidence of malignancy  . Occipital neuralgia   . Cataract    Past Surgical History  Procedure Laterality Date  . Abdominal hysterectomy    . Neck surgery    . Cervical spine surgery    . Breast surgery Bilateral   . Tubal ligation    . Bladder tack    . Temporal artery biopsy / ligation Right    Family History  Problem Relation Age of Onset  . Cancer - Colon Mother   . Cancer Mother     Breast    History  Substance Use Topics  . Smoking status: Never Smoker   . Smokeless tobacco: Never Used  . Alcohol Use: No   OB History   Grav Para Term Preterm Abortions TAB SAB Ect Mult Living   7 4 4  3  3   3      Review of Systems  Constitutional: Negative for fever and chills.  Respiratory: Negative for  shortness of breath.   Cardiovascular: Negative for chest pain.  Gastrointestinal: Negative for nausea, vomiting, diarrhea and constipation.       No bowel incontinence  Genitourinary: Negative for dysuria.       No urinary incontinence  Musculoskeletal: Positive for arthralgias, back pain and myalgias.  Neurological:       No saddle anesthesia      Allergies  Baclofen; Morphine and related; Nsaids; Ciprofloxacin; and Iohexol  Home Medications   Prior to Admission medications   Medication Sig Start Date End Date Taking? Authorizing Provider  ALPRAZolam (XANAX) 0.25 MG tablet Take 0.25 mg by mouth 2 (two) times daily as needed for anxiety.    Yes Historical Provider, MD  cyclobenzaprine (FLEXERIL) 5 MG tablet Take 1 tablet (5 mg total) by mouth 3 (three) times daily as needed for muscle spasms. 08/20/13  Yes [name], MD  HYDROcodone-acetaminophen (NORCO/VICODIN) 5-325 MG per tablet Take 1 tablet by mouth 2 (two) times daily as needed for moderate pain. Take twice a day as needed.   Yes Historical Provider, MD  morphine (MSIR) 15 MG tablet Take 15 mg by mouth every 4 (four) hours as needed for moderate pain.  08/26/13   Historical Provider, MD  BP 126/71  Pulse 104  Temp(Src) 98.3 F (36.8 C) (Oral)  Resp 21  Ht 4\' 9"  (1.448 m)  Wt 111 lb (50.349 kg)  BMI 24.01 kg/m2  SpO2 98% Physical Exam  Nursing note and vitals reviewed. Constitutional: She is oriented to person, place, and time. She appears well-developed and well-nourished. No distress.  HENT:  Head: Normocephalic and atraumatic.  Eyes: Conjunctivae and EOM are normal. Right eye exhibits no discharge. Left eye exhibits no discharge. No scleral icterus.  Neck: Normal range of motion. Neck supple. No tracheal deviation present.  Cardiovascular: Normal rate, regular rhythm and normal heart sounds.  Exam reveals no gallop and no friction rub.   No murmur heard. Pulmonary/Chest: Effort normal and breath sounds normal. No  respiratory distress. She has no wheezes.  Abdominal: Soft. She exhibits no distension. There is no tenderness.  Musculoskeletal: Normal range of motion.  Left lumbar paraspinal muscles tender to palpation, no bony tenderness, step-offs, or gross abnormality or deformity of spine, patient is able to ambulate, moves all extremities  Right great toe extension intact Right plantar/dorsiflexion intact  Left great toe extension 4/5 Left dorsiflexion 4/5  Neurological: She is alert and oriented to person, place, and time. She has normal reflexes.  Sensation and strength intact bilaterally Symmetrical reflexes  Skin: Skin is warm. She is not diaphoretic.  No erythema, or evidence of abscess or infection  Psychiatric: She has a normal mood and affect. Her behavior is normal. Judgment and thought content normal.    ED Course  Procedures (including critical care time) Labs Review Labs Reviewed - No data to display  Imaging Review No results found.   EKG Interpretation None      MDM   Final diagnoses:  Back pain    Patient with chronic back pain. Recently began having radiating symptoms down left leg with associated left foot drop. She has been seen by orthopedics. Discussed patient with Dr. Jeneen Rinks. Will treat the patient's pain, and give some Decadron. Anticipate discharge to home. Discussed with patient that despite her symptoms, admission is likely not warranted at this time.  Discussed the patient with Dr. Jeneen Rinks, who agrees that admission is unlikely, but recommends calling orthopedics.  12:03 AM Discussed the patient with Dr. Veverly Fells, who will kindly admit the patient.    Montine Circle, PA-C 09/06/13 0004

## 2013-09-05 NOTE — ED Notes (Signed)
Pt c/o L lower back pain radiating down L leg. Pt dx with bulging disk resting on sciatic nerve. Pt is due to have surgery, ortho advised to come to ED. Pt has taken home narcotics with no relief.

## 2013-09-05 NOTE — H&P (Signed)
Valerie Hampton is an 73 y.o. female.   Chief Complaint: left leg and LBP HPI: 73 yo female with a history of spinal stenosis who has had a dramatic increase in left leg pain today. Patient has had a several week history of increasing low back pain since doing some yard work with her husband.  She has been seen at [facility] and recently at [facility] by Drs Nelva Bush and Gladstone Lighter.  She had a lumbar ESI earlier this week with brief partial relief.  Starting at 3am today her pain has become much worse, especially in the left leg down below the knee.  Denies bowel or bladder incontinence.  Past Medical History  Diagnosis Date  . Fibromyalgia   . Chronic back pain   . Allergic rhinitis   . Dyspnea     chronic negative eval to datev/q, stress test  . Osteopenia   . Intraductal papilloma 2007    no evidence of malignancy  . Occipital neuralgia   . Cataract     Past Surgical History  Procedure Laterality Date  . Abdominal hysterectomy    . Neck surgery    . Cervical spine surgery    . Breast surgery Bilateral   . Tubal ligation    . Bladder tack    . Temporal artery biopsy / ligation Right     Family History  Problem Relation Age of Onset  . Cancer - Colon Mother   . Cancer Mother     Breast    Social History:  reports that she has never smoked. She has never used smokeless tobacco. She reports that she does not drink alcohol or use illicit drugs.  Allergies:  Allergies  Allergen Reactions  . Baclofen   . Morphine And Related Nausea And Vomiting  . Nsaids Other (See Comments)    Agitates stomach due to acid reflux  . Ciprofloxacin Rash  . Iohexol Rash     (Not in a hospital admission)  No results found for this or any previous visit (from the past 48 hour(s)). No results found.  ROS  Blood pressure 122/57, pulse 86, temperature 98.3 F (36.8 C), temperature source Oral, resp. rate 19, height 4\' 9"  (1.448 m), weight 50.349 kg (111 lb), SpO2 100.00%. Physical Exam  AAO in severe distress, neck nontender, tender in the lumbar area especially L PSIS, grade III weakness in the left LE in TA and EHL(baseline from earlier in the week per family), right LE 5/5 motor.  Pain relieved a little with hip and knee flexion.  Assessment/Plan Spinal stenosis with sever flare.  Patient unable to tolerate the pain at home.  Family unable to care for her. Plan admission and pain control. Bed rest. DVT prophylaxis.  Will discuss with Dr Gladstone Lighter tomorrow AM  Augustin Schooling 09/05/2013, 11:57 PM

## 2013-09-06 ENCOUNTER — Encounter (HOSPITAL_COMMUNITY): Payer: Self-pay | Admitting: *Deleted

## 2013-09-06 ENCOUNTER — Inpatient Hospital Stay (HOSPITAL_COMMUNITY): Payer: Medicare HMO

## 2013-09-06 DIAGNOSIS — M48061 Spinal stenosis, lumbar region without neurogenic claudication: Secondary | ICD-10-CM | POA: Diagnosis present

## 2013-09-06 LAB — CBC WITH DIFFERENTIAL/PLATELET
Basophils Absolute: 0 10*3/uL (ref 0.0–0.1)
Basophils Relative: 0 % (ref 0–1)
Eosinophils Absolute: 0 10*3/uL (ref 0.0–0.7)
Eosinophils Relative: 0 % (ref 0–5)
HEMATOCRIT: 37.5 % (ref 36.0–46.0)
HEMOGLOBIN: 12.6 g/dL (ref 12.0–15.0)
LYMPHS ABS: 0.8 10*3/uL (ref 0.7–4.0)
LYMPHS PCT: 10 % — AB (ref 12–46)
MCH: 30.1 pg (ref 26.0–34.0)
MCHC: 33.6 g/dL (ref 30.0–36.0)
MCV: 89.7 fL (ref 78.0–100.0)
MONO ABS: 0 10*3/uL — AB (ref 0.1–1.0)
Monocytes Relative: 0 % — ABNORMAL LOW (ref 3–12)
NEUTROS PCT: 90 % — AB (ref 43–77)
Neutro Abs: 7 10*3/uL (ref 1.7–7.7)
Platelets: 303 10*3/uL (ref 150–400)
RBC: 4.18 MIL/uL (ref 3.87–5.11)
RDW: 13.2 % (ref 11.5–15.5)
WBC: 7.8 10*3/uL (ref 4.0–10.5)

## 2013-09-06 LAB — PROTIME-INR
INR: 0.92 (ref 0.00–1.49)
PROTHROMBIN TIME: 12.2 s (ref 11.6–15.2)

## 2013-09-06 LAB — BASIC METABOLIC PANEL
BUN: 17 mg/dL (ref 6–23)
CHLORIDE: 105 meq/L (ref 96–112)
CO2: 25 mEq/L (ref 19–32)
Calcium: 9.4 mg/dL (ref 8.4–10.5)
Creatinine, Ser: 0.88 mg/dL (ref 0.50–1.10)
GFR, EST AFRICAN AMERICAN: 75 mL/min — AB (ref >90)
GFR, EST NON AFRICAN AMERICAN: 65 mL/min — AB (ref >90)
Glucose, Bld: 143 mg/dL — ABNORMAL HIGH (ref 70–99)
POTASSIUM: 5.3 meq/L (ref 3.7–5.3)
SODIUM: 142 meq/L (ref 137–147)

## 2013-09-06 MED ORDER — BISACODYL 5 MG PO TBEC
5.0000 mg | DELAYED_RELEASE_TABLET | Freq: Every day | ORAL | Status: DC | PRN
  Administered 2013-09-06: 5 mg via ORAL
  Filled 2013-09-06: qty 1

## 2013-09-06 MED ORDER — SODIUM CHLORIDE 0.9 % IV SOLN
INTRAVENOUS | Status: DC
  Administered 2013-09-06: 15:00:00 via INTRAVENOUS
  Administered 2013-09-06: 50 mL/h via INTRAVENOUS
  Administered 2013-09-07: 06:00:00 via INTRAVENOUS

## 2013-09-06 MED ORDER — METOCLOPRAMIDE HCL 10 MG PO TABS: 5.0000 mg | ORAL_TABLET | Freq: Three times a day (TID) | ORAL | Status: DC | PRN

## 2013-09-06 MED ORDER — HYDROCODONE-ACETAMINOPHEN 10-325 MG PO TABS: 1.0000 | ORAL_TABLET | ORAL | Status: DC | PRN

## 2013-09-06 MED ORDER — HYDROMORPHONE HCL PF 1 MG/ML IJ SOLN
0.5000 mg | INTRAMUSCULAR | Status: DC | PRN
  Administered 2013-09-06 – 2013-09-07 (×3): 1 mg via INTRAVENOUS
  Filled 2013-09-06 (×3): qty 1

## 2013-09-06 MED ORDER — METOCLOPRAMIDE HCL 5 MG/ML IJ SOLN: 5.0000 mg | Freq: Three times a day (TID) | INTRAMUSCULAR | Status: DC | PRN

## 2013-09-06 MED ORDER — SENNA 8.6 MG PO TABS
1.0000 | ORAL_TABLET | Freq: Every evening | ORAL | Status: DC | PRN
  Administered 2013-09-06: 8.6 mg via ORAL
  Filled 2013-09-06: qty 1

## 2013-09-06 MED ORDER — ONDANSETRON HCL 4 MG/2ML IJ SOLN: 4.0000 mg | Freq: Four times a day (QID) | INTRAMUSCULAR | Status: DC | PRN

## 2013-09-06 MED ORDER — ALPRAZOLAM 0.25 MG PO TABS
0.2500 mg | ORAL_TABLET | Freq: Two times a day (BID) | ORAL | Status: DC | PRN
  Administered 2013-09-06: 0.25 mg via ORAL
  Filled 2013-09-06: qty 1

## 2013-09-06 MED ORDER — CYCLOBENZAPRINE HCL 10 MG PO TABS
10.0000 mg | ORAL_TABLET | Freq: Three times a day (TID) | ORAL | Status: DC | PRN
  Filled 2013-09-06: qty 1

## 2013-09-06 MED ORDER — ENOXAPARIN SODIUM 40 MG/0.4ML ~~LOC~~ SOLN
40.0000 mg | SUBCUTANEOUS | Status: DC
  Filled 2013-09-06 (×2): qty 0.4

## 2013-09-06 MED ORDER — ONDANSETRON HCL 4 MG PO TABS: 4.0000 mg | ORAL_TABLET | Freq: Four times a day (QID) | ORAL | Status: DC | PRN

## 2013-09-06 NOTE — Progress Notes (Signed)
Subjective:     Patient reports pain as 4 on 0-10 scale.Admitted for pain control by my partner Dr.Norris.Today after Fentenyl she is much better.My plan is to ambulate her with PT tomorrow.We are awaiting Insurance approval for surgery next week. She has a Left Foot Drop.She is on Lovenox.    Objective: Vital signs in last 24 hours: Temp:  [97.7 F (36.5 C)-98.4 F (36.9 C)] 97.7 F (36.5 C) (06/06 0552) Pulse Rate:  [85-104] 87 (06/06 0552) Resp:  [19-21] 19 (06/05 2307) BP: (100-128)/(57-80) 100/60 mmHg (06/06 0552) SpO2:  [97 %-100 %] 97 % (06/06 0552) Weight:  [50.349 kg (111 lb)-51.075 kg (112 lb 9.6 oz)] 51.075 kg (112 lb 9.6 oz) (06/06 0152)  Intake/Output from previous day:   Intake/Output this shift:     Recent Labs  09/06/13 0510  HGB 12.6    Recent Labs  09/06/13 0510  WBC 7.8  RBC 4.18  HCT 37.5  PLT 303    Recent Labs  09/06/13 0510  NA 142  K 5.3  CL 105  CO2 25  BUN 17  CREATININE 0.88  GLUCOSE 143*  CALCIUM 9.4   No results found for this basename: LABPT, INR,  in the last 72 hours  Foot Drop on the Left.  Assessment/Plan:     Up with therapy  Tobi Bastos 09/06/2013, 12:04 PM

## 2013-09-06 NOTE — Progress Notes (Signed)
Subjective: Patient was admitted yesterday for back pain. She is a Patient of Dr. Gladstone Lighter, who is planning on operating at some point for lumbar spinal stenosis. Pain radiates down LLE, and is worse with ambulation and dangling of left leg. No new injury. She reports she is felling better today. No Bowel or bladder changes. Denies SOB, or CP.  Objective: Vital signs in last 24 hours: Temp:  [97.7 F (36.5 C)-98.4 F (36.9 C)] 97.7 F (36.5 C) (06/06 0552) Pulse Rate:  [85-104] 87 (06/06 0552) Resp:  [19-21] 19 (06/05 2307) BP: (100-128)/(57-80) 100/60 mmHg (06/06 0552) SpO2:  [97 %-100 %] 97 % (06/06 0552) Weight:  [50.349 kg (111 lb)-51.075 kg (112 lb 9.6 oz)] 51.075 kg (112 lb 9.6 oz) (06/06 0152)  Intake/Output from previous day:   Intake/Output this shift:     Recent Labs  09/06/13 0510  HGB 12.6    Recent Labs  09/06/13 0510  WBC 7.8  RBC 4.18  HCT 37.5  PLT 303    Recent Labs  09/06/13 0510  NA 142  K 5.3  CL 105  CO2 25  BUN 17  CREATININE 0.88  GLUCOSE 143*  CALCIUM 9.4   No results found for this basename: LABPT, INR,  in the last 72 hours  Alert and oriented x3. Left foot drop. Weakness to left LE. Sensation intact and 2+ pedal pulse bilateral. Not tender on palpation.      Assessment/Plan: Lumbar spinal stenosis: Add diet Dr. Leigh Aurora to evaluate this afternoon Possible surgery next week Continue pain management    Bach Rocchi L Josette Shimabukuro 09/06/2013, 9:13 AM

## 2013-09-07 LAB — BASIC METABOLIC PANEL
BUN: 20 mg/dL (ref 6–23)
CALCIUM: 9.5 mg/dL (ref 8.4–10.5)
CO2: 27 mEq/L (ref 19–32)
CREATININE: 0.95 mg/dL (ref 0.50–1.10)
Chloride: 104 mEq/L (ref 96–112)
GFR, EST AFRICAN AMERICAN: 68 mL/min — AB (ref >90)
GFR, EST NON AFRICAN AMERICAN: 59 mL/min — AB (ref >90)
Glucose, Bld: 96 mg/dL (ref 70–99)
Potassium: 4.4 mEq/L (ref 3.7–5.3)
Sodium: 141 mEq/L (ref 137–147)

## 2013-09-07 MED ORDER — BISACODYL 10 MG RE SUPP
10.0000 mg | Freq: Once | RECTAL | Status: AC
  Administered 2013-09-07: 10 mg via RECTAL
  Filled 2013-09-07: qty 1

## 2013-09-07 MED ORDER — FLEET ENEMA 7-19 GM/118ML RE ENEM
1.0000 | ENEMA | Freq: Once | RECTAL | Status: AC
  Administered 2013-09-07: 1 via RECTAL
  Filled 2013-09-07: qty 1

## 2013-09-07 MED ORDER — OXYCODONE HCL 5 MG PO TABS: 5.0000 mg | ORAL_TABLET | ORAL | Status: DC | PRN

## 2013-09-07 NOTE — Progress Notes (Addendum)
   Subjective: LUMBAR PAIN / RADICULOPATHY  Patient reports pain as mild, controlled well at this time. No events throughout the night. She is to have surgery by Dr. Gladstone Lighter soon, but waiting on insurance to approve the procedure.  Feels well enough to be discharge home.  Objective:   VITALS:   Filed Vitals:   09/07/13 0525  BP: 123/71  Pulse: 84  Temp: 97.8 F (36.6 C)  Resp: 20    Alert and oriented x3.  Left foot drop. Weakness to left LE.  Sensation intact and 2+ pedal pulse bilateral.  Not tender on palpation.  LABS  Recent Labs  09/06/13 0510  HGB 12.6  HCT 37.5  WBC 7.8  PLT 303     Recent Labs  09/06/13 0510 09/07/13 0514  NA 142 141  K 5.3 4.4  BUN 17 20  CREATININE 0.88 0.95  GLUCOSE 143* 96     Assessment/Plan: LUMBAR PAIN / RADICULOPATHY  Discussed pain medication, Rx for Oxy given which she can use if needed, if not use the Norco she already has at home. Discharge home Follow up with Dr. Gladstone Lighter in as needed / when scheduled for surgery.  Contact information:  [facility] [address] [place]   PAC  09/07/2013, 10:21 AM

## 2013-09-07 NOTE — Evaluation (Signed)
Physical Therapy Evaluation Patient Details Name: Valerie Hampton MRN: [id number] DOB: [birth year] Today's Date: 09/07/2013   History of Present Illness  pt adm  09/05/13 with spinal stenosis, pain; back surgery pending insurance approval  Clinical Impression  Discussed steps; instructed pt and family in back precautions; pt plans to go home and is awaiting surgery    Follow Up Recommendations No PT follow up    Equipment Recommendations  Rolling walker with 5" wheels (petite RW)    Recommendations for Other Services       Precautions / Restrictions Precautions Precautions: Back Precaution Booklet Issued: Yes (comment)      Mobility  Bed Mobility                  Transfers Overall transfer level: Needs assistance Equipment used: None;Rolling walker (2 wheeled) Transfers: Sit to/from Stand Sit to Stand: Supervision         General transfer comment: cues for posture and   hand placement  Ambulation/Gait Ambulation/Gait assistance: Supervision (x3) Ambulation Distance (Feet): 140 Feet (x 2) Assistive device: Rolling walker (2 wheeled);None Gait Pattern/deviations: Step-through pattern;Decreased dorsiflexion - left     General Gait Details: cues for RW safety and  posture  Stairs            Wheelchair Mobility    Modified Rankin (Stroke Patients Only)       Balance Overall balance assessment: Independent                                           Pertinent Vitals/Pain Left lower leg pain, improved per pt    Home Living Family/patient expects to be discharged to:: Private residence Living Arrangements: Spouse/significant other Available Help at Discharge: Family Type of Home: House Home Access: Stairs to enter   Technical brewer of Steps: 2-3 Home Layout: One level Home Equipment: Cane - single point;Walker - 4 wheels (husband's equip)      Prior Function Level of Independence: Independent                Hand Dominance        Extremity/Trunk Assessment   Upper Extremity Assessment: Overall WFL for tasks assessed           Lower Extremity Assessment: LLE deficits/detail   LLE Deficits / Details: ankle DF 2+/5     Communication   Communication: No difficulties  Cognition Arousal/Alertness: Awake/alert Behavior During Therapy: WFL for tasks assessed/performed Overall Cognitive Status: Within Functional Limits for tasks assessed                      General Comments      Exercises        Assessment/Plan    PT Assessment Patent does not need any further PT services  PT Diagnosis     PT Problem List    PT Treatment Interventions     PT Goals (Current goals can be found in the Care Plan section) Acute Rehab PT Goals Patient Stated Goal: back surgery PT Goal Formulation: With patient Time For Goal Achievement: 09/14/13 Potential to Achieve Goals: Good    Frequency     Barriers to discharge        Co-evaluation               End of Session Equipment Utilized During Treatment: Gait belt Activity Tolerance: Patient  tolerated treatment well Patient left: in chair;with call bell/phone within reach;with family/visitor present Nurse Communication: Mobility status         Time: 3785-8850 PT Time Calculation (min): 31 min   Charges:   PT Evaluation $Initial PT Evaluation Tier I: 1 Procedure PT Treatments $Gait Training: 23-37 mins   PT G Codes:          Neil Crouch 09/07/2013, 12:20 PM

## 2013-09-08 ENCOUNTER — Other Ambulatory Visit: Payer: Self-pay | Admitting: Surgical

## 2013-09-08 ENCOUNTER — Encounter (HOSPITAL_COMMUNITY): Payer: Self-pay | Admitting: *Deleted

## 2013-09-08 NOTE — Progress Notes (Signed)
CARE MANAGEMENT NOTE 09/08/2013  Patient:  Valerie Hampton, Valerie Hampton   Account Number:  [id number]  Date Initiated:  09/08/2013  Documentation initiated by:  [facility]  Subjective/Objective Assessment:   back pain     Action/Plan:   Anticipated DC Date:  09/07/2013   Anticipated DC Plan:  [place]  CM consult      Choice offered to / List presented to:     DME arranged  [place]      DME agency  [facility]        Status of service:  Completed, signed off Medicare Important Message given?  NA - LOS <3 / Initial given by admissions (If response is "NO", the following Medicare IM given date fields will be blank) Date Medicare IM given:   Date Additional Medicare IM given:    Discharge Disposition:  HOME/SELF CARE  Per UR Regulation:    If discussed at Long Length of Stay Meetings, dates discussed:    Comments:  09/08/2013 0900 NCM spoke to [facility] and they do have junior RW. Faxed order to [facility] # [phone] fax [phone]. They will deliver to her home. Jonnie Finner RN CCM Case Mgmt phone [phone]  09/07/2013 1300 NCM spoke to pt and states she can use her husband's RW until she receives her RW. Explained [facility] will call her tomorrow for delivery time to home. Jonnie Finner RN CCM Case Mgmt phone [phone]

## 2013-09-09 ENCOUNTER — Inpatient Hospital Stay (HOSPITAL_COMMUNITY)
Admission: RE | Admit: 2013-09-09 | Discharge: 2013-09-11 | DRG: 520 | Disposition: A | Discharge status: Home / Self Care | Payer: Medicare HMO | Source: Ambulatory Visit | Attending: Orthopedic Surgery | Admitting: Orthopedic Surgery

## 2013-09-09 ENCOUNTER — Encounter (HOSPITAL_COMMUNITY): Payer: Self-pay | Admitting: *Deleted

## 2013-09-09 ENCOUNTER — Inpatient Hospital Stay (HOSPITAL_COMMUNITY): Payer: Medicare HMO

## 2013-09-09 ENCOUNTER — Inpatient Hospital Stay (HOSPITAL_COMMUNITY): Payer: Medicare HMO | Admitting: Anesthesiology

## 2013-09-09 ENCOUNTER — Encounter (HOSPITAL_COMMUNITY)
Admission: RE | Disposition: A | Discharge status: Home / Self Care | Payer: Self-pay | Source: Ambulatory Visit | Attending: Orthopedic Surgery

## 2013-09-09 ENCOUNTER — Encounter (HOSPITAL_COMMUNITY): Payer: Medicare HMO | Admitting: Anesthesiology

## 2013-09-09 DIAGNOSIS — M5126 Other intervertebral disc displacement, lumbar region: Principal | ICD-10-CM | POA: Diagnosis present

## 2013-09-09 DIAGNOSIS — IMO0002 Reserved for concepts with insufficient information to code with codable children: Secondary | ICD-10-CM

## 2013-09-09 DIAGNOSIS — IMO0001 Reserved for inherently not codable concepts without codable children: Secondary | ICD-10-CM | POA: Diagnosis present

## 2013-09-09 DIAGNOSIS — K5900 Constipation, unspecified: Secondary | ICD-10-CM | POA: Diagnosis not present

## 2013-09-09 DIAGNOSIS — M48062 Spinal stenosis, lumbar region with neurogenic claudication: Secondary | ICD-10-CM | POA: Diagnosis present

## 2013-09-09 DIAGNOSIS — M216X9 Other acquired deformities of unspecified foot: Secondary | ICD-10-CM | POA: Diagnosis present

## 2013-09-09 HISTORY — DX: Nausea with vomiting, unspecified: R11.2

## 2013-09-09 HISTORY — DX: Depression, unspecified: F32.A

## 2013-09-09 HISTORY — DX: Unspecified osteoarthritis, unspecified site: M19.90

## 2013-09-09 HISTORY — DX: Headache: R51

## 2013-09-09 HISTORY — DX: Major depressive disorder, single episode, unspecified: F32.9

## 2013-09-09 HISTORY — DX: Other specified postprocedural states: Z98.890

## 2013-09-09 HISTORY — DX: Family history of other specified conditions: Z84.89

## 2013-09-09 HISTORY — DX: Gastro-esophageal reflux disease without esophagitis: K21.9

## 2013-09-09 HISTORY — DX: Anxiety disorder, unspecified: F41.9

## 2013-09-09 HISTORY — PX: LUMBAR LAMINECTOMY/DECOMPRESSION MICRODISCECTOMY: SHX5026

## 2013-09-09 LAB — COMPREHENSIVE METABOLIC PANEL
ALT: 27 U/L (ref 0–35)
AST: 14 U/L (ref 0–37)
Albumin: 3.1 g/dL — ABNORMAL LOW (ref 3.5–5.2)
Alkaline Phosphatase: 75 U/L (ref 39–117)
BUN: 13 mg/dL (ref 6–23)
CO2: 26 mEq/L (ref 19–32)
Calcium: 9.3 mg/dL (ref 8.4–10.5)
Chloride: 104 mEq/L (ref 96–112)
Creatinine, Ser: 0.89 mg/dL (ref 0.50–1.10)
GFR calc Af Amer: 74 mL/min — ABNORMAL LOW (ref >90)
GFR calc non Af Amer: 64 mL/min — ABNORMAL LOW (ref >90)
Glucose, Bld: 86 mg/dL (ref 70–99)
Potassium: 4 mEq/L (ref 3.7–5.3)
Sodium: 142 mEq/L (ref 137–147)
Total Bilirubin: 0.3 mg/dL (ref 0.3–1.2)
Total Protein: 6.5 g/dL (ref 6.0–8.3)

## 2013-09-09 LAB — TYPE AND SCREEN
ABO/RH(D): O POS
Antibody Screen: NEGATIVE

## 2013-09-09 LAB — URINE MICROSCOPIC-ADD ON

## 2013-09-09 LAB — URINALYSIS, ROUTINE W REFLEX MICROSCOPIC
Bilirubin Urine: NEGATIVE
Glucose, UA: NEGATIVE mg/dL
Hgb urine dipstick: NEGATIVE
Ketones, ur: NEGATIVE mg/dL
Nitrite: NEGATIVE
Protein, ur: NEGATIVE mg/dL
Specific Gravity, Urine: 1.011 (ref 1.005–1.030)
Urobilinogen, UA: 0.2 mg/dL (ref 0.0–1.0)
pH: 7 (ref 5.0–8.0)

## 2013-09-09 LAB — APTT: aPTT: 26 seconds (ref 24–37)

## 2013-09-09 LAB — ABO/RH: ABO/RH(D): O POS

## 2013-09-09 LAB — PROTIME-INR
INR: 0.93 (ref 0.00–1.49)
Prothrombin Time: 12.3 seconds (ref 11.6–15.2)

## 2013-09-09 SURGERY — LUMBAR LAMINECTOMY/DECOMPRESSION MICRODISCECTOMY
Anesthesia: General | Site: Back

## 2013-09-09 MED ORDER — FENTANYL CITRATE 0.05 MG/ML IJ SOLN
INTRAMUSCULAR | Status: AC
  Filled 2013-09-09: qty 5

## 2013-09-09 MED ORDER — HYDROMORPHONE HCL PF 1 MG/ML IJ SOLN
INTRAMUSCULAR | Status: AC
  Filled 2013-09-09: qty 1

## 2013-09-09 MED ORDER — BUPIVACAINE-EPINEPHRINE (PF) 0.25% -1:200000 IJ SOLN
INTRAMUSCULAR | Status: DC | PRN
  Administered 2013-09-09: 15 mL via PERINEURAL

## 2013-09-09 MED ORDER — PROPOFOL 10 MG/ML IV BOLUS
INTRAVENOUS | Status: DC | PRN
  Administered 2013-09-09: 100 mg via INTRAVENOUS

## 2013-09-09 MED ORDER — PHENOL 1.4 % MT LIQD
1.0000 | OROMUCOSAL | Status: DC | PRN
  Filled 2013-09-09: qty 177

## 2013-09-09 MED ORDER — BISACODYL 5 MG PO TBEC
5.0000 mg | DELAYED_RELEASE_TABLET | Freq: Every day | ORAL | Status: DC | PRN
Start: 2013-09-09 — End: 2013-09-11
  Administered 2013-09-10: 5 mg via ORAL
  Filled 2013-09-09: qty 1

## 2013-09-09 MED ORDER — FENTANYL CITRATE 0.05 MG/ML IJ SOLN: 25.0000 ug | Freq: Once | INTRAMUSCULAR | Status: DC

## 2013-09-09 MED ORDER — BUPIVACAINE LIPOSOME 1.3 % IJ SUSP
20.0000 mL | Freq: Once | INTRAMUSCULAR | Status: DC
  Filled 2013-09-09: qty 20

## 2013-09-09 MED ORDER — MIDAZOLAM HCL 5 MG/5ML IJ SOLN
INTRAMUSCULAR | Status: DC | PRN
  Administered 2013-09-09: 2 mg via INTRAVENOUS

## 2013-09-09 MED ORDER — ROCURONIUM BROMIDE 100 MG/10ML IV SOLN
INTRAVENOUS | Status: DC | PRN
  Administered 2013-09-09: 10 mg via INTRAVENOUS
  Administered 2013-09-09: 20 mg via INTRAVENOUS
  Administered 2013-09-09 (×2): 10 mg via INTRAVENOUS

## 2013-09-09 MED ORDER — PROMETHAZINE HCL 25 MG/ML IJ SOLN: 6.2500 mg | INTRAMUSCULAR | Status: DC | PRN

## 2013-09-09 MED ORDER — MUPIROCIN 2 % EX OINT
TOPICAL_OINTMENT | Freq: Two times a day (BID) | CUTANEOUS | Status: DC
  Administered 2013-09-09: 1 via NASAL
  Filled 2013-09-09: qty 22

## 2013-09-09 MED ORDER — METHOCARBAMOL 1000 MG/10ML IJ SOLN
500.0000 mg | Freq: Four times a day (QID) | INTRAMUSCULAR | Status: DC | PRN
  Administered 2013-09-09: 500 mg via INTRAVENOUS
  Filled 2013-09-09: qty 5

## 2013-09-09 MED ORDER — GLYCOPYRROLATE 0.2 MG/ML IJ SOLN
INTRAMUSCULAR | Status: DC | PRN
  Administered 2013-09-09: 0.4 mg via INTRAVENOUS

## 2013-09-09 MED ORDER — HYDROMORPHONE HCL PF 1 MG/ML IJ SOLN
0.2500 mg | INTRAMUSCULAR | Status: DC | PRN
  Administered 2013-09-09 (×4): 0.5 mg via INTRAVENOUS

## 2013-09-09 MED ORDER — SUCCINYLCHOLINE CHLORIDE 20 MG/ML IJ SOLN
INTRAMUSCULAR | Status: DC | PRN
  Administered 2013-09-09: 100 mg via INTRAVENOUS

## 2013-09-09 MED ORDER — MIDAZOLAM HCL 2 MG/2ML IJ SOLN
INTRAMUSCULAR | Status: AC
  Filled 2013-09-09: qty 2

## 2013-09-09 MED ORDER — FENTANYL CITRATE 0.05 MG/ML IJ SOLN
INTRAMUSCULAR | Status: AC
  Filled 2013-09-09: qty 2

## 2013-09-09 MED ORDER — MENTHOL 3 MG MT LOZG
1.0000 | LOZENGE | OROMUCOSAL | Status: DC | PRN
  Filled 2013-09-09: qty 9

## 2013-09-09 MED ORDER — FLEET ENEMA 7-19 GM/118ML RE ENEM: 1.0000 | ENEMA | Freq: Once | RECTAL | Status: AC | PRN

## 2013-09-09 MED ORDER — ACETAMINOPHEN 650 MG RE SUPP: 650.0000 mg | RECTAL | Status: DC | PRN

## 2013-09-09 MED ORDER — FENTANYL CITRATE 0.05 MG/ML IJ SOLN
25.0000 ug | INTRAMUSCULAR | Status: DC | PRN
  Administered 2013-09-09 (×2): 25 ug via INTRAVENOUS
  Filled 2013-09-09: qty 2

## 2013-09-09 MED ORDER — ACETAMINOPHEN 325 MG PO TABS: 650.0000 mg | ORAL_TABLET | ORAL | Status: DC | PRN

## 2013-09-09 MED ORDER — CEFAZOLIN SODIUM 1-5 GM-% IV SOLN
1.0000 g | Freq: Three times a day (TID) | INTRAVENOUS | Status: AC
  Administered 2013-09-10 (×3): 1 g via INTRAVENOUS
  Filled 2013-09-09 (×3): qty 50

## 2013-09-09 MED ORDER — LIDOCAINE HCL (PF) 2 % IJ SOLN
INTRAMUSCULAR | Status: DC | PRN
  Administered 2013-09-09: 75 mg via INTRADERMAL

## 2013-09-09 MED ORDER — BUPIVACAINE LIPOSOME 1.3 % IJ SUSP
INTRAMUSCULAR | Status: DC | PRN
  Administered 2013-09-09: 20 mL

## 2013-09-09 MED ORDER — ONDANSETRON HCL 4 MG/2ML IJ SOLN
4.0000 mg | INTRAMUSCULAR | Status: DC | PRN
  Administered 2013-09-10 – 2013-09-11 (×2): 4 mg via INTRAVENOUS
  Filled 2013-09-09 (×2): qty 2

## 2013-09-09 MED ORDER — LIDOCAINE HCL (CARDIAC) 20 MG/ML IV SOLN
INTRAVENOUS | Status: AC
  Filled 2013-09-09: qty 5

## 2013-09-09 MED ORDER — LACTATED RINGERS IV SOLN: INTRAVENOUS | Status: DC

## 2013-09-09 MED ORDER — BACITRACIN-NEOMYCIN-POLYMYXIN 400-5-5000 EX OINT
TOPICAL_OINTMENT | CUTANEOUS | Status: AC
  Filled 2013-09-09: qty 1

## 2013-09-09 MED ORDER — PROPOFOL 10 MG/ML IV BOLUS
INTRAVENOUS | Status: AC
  Filled 2013-09-09: qty 20

## 2013-09-09 MED ORDER — NEOSTIGMINE METHYLSULFATE 10 MG/10ML IV SOLN
INTRAVENOUS | Status: AC
  Filled 2013-09-09: qty 1

## 2013-09-09 MED ORDER — LACTATED RINGERS IV SOLN
INTRAVENOUS | Status: DC
  Administered 2013-09-09 (×2): via INTRAVENOUS

## 2013-09-09 MED ORDER — METHOCARBAMOL 500 MG PO TABS
500.0000 mg | ORAL_TABLET | Freq: Four times a day (QID) | ORAL | Status: DC | PRN
  Administered 2013-09-10 (×2): 500 mg via ORAL
  Filled 2013-09-09 (×2): qty 1

## 2013-09-09 MED ORDER — THROMBIN 5000 UNITS EX SOLR
CUTANEOUS | Status: DC | PRN
  Administered 2013-09-09: 5000 [IU] via TOPICAL

## 2013-09-09 MED ORDER — LACTATED RINGERS IV SOLN
INTRAVENOUS | Status: DC
  Administered 2013-09-09 – 2013-09-10 (×2): via INTRAVENOUS

## 2013-09-09 MED ORDER — CEFAZOLIN SODIUM-DEXTROSE 2-3 GM-% IV SOLR
2.0000 g | INTRAVENOUS | Status: AC
  Administered 2013-09-09: 2 g via INTRAVENOUS

## 2013-09-09 MED ORDER — HYDROMORPHONE HCL PF 1 MG/ML IJ SOLN
0.5000 mg | INTRAMUSCULAR | Status: DC | PRN
  Administered 2013-09-09 (×2): 0.5 mg via INTRAVENOUS
  Administered 2013-09-10 (×4): 1 mg via INTRAVENOUS
  Filled 2013-09-09 (×6): qty 1

## 2013-09-09 MED ORDER — THROMBIN 5000 UNITS EX SOLR
CUTANEOUS | Status: AC
  Filled 2013-09-09: qty 10000

## 2013-09-09 MED ORDER — BACITRACIN-NEOMYCIN-POLYMYXIN 400-5-5000 EX OINT
TOPICAL_OINTMENT | CUTANEOUS | Status: DC | PRN
  Administered 2013-09-09: 1 via TOPICAL

## 2013-09-09 MED ORDER — ONDANSETRON HCL 4 MG/2ML IJ SOLN
INTRAMUSCULAR | Status: DC | PRN
  Administered 2013-09-09: 4 mg via INTRAVENOUS

## 2013-09-09 MED ORDER — FENTANYL CITRATE 0.05 MG/ML IJ SOLN
INTRAMUSCULAR | Status: DC | PRN
  Administered 2013-09-09: 100 ug via INTRAVENOUS
  Administered 2013-09-09: 50 ug via INTRAVENOUS
  Administered 2013-09-09 (×2): 100 ug via INTRAVENOUS

## 2013-09-09 MED ORDER — NEOSTIGMINE METHYLSULFATE 10 MG/10ML IV SOLN
INTRAVENOUS | Status: DC | PRN
  Administered 2013-09-09: 3 mg via INTRAVENOUS

## 2013-09-09 MED ORDER — OXYCODONE-ACETAMINOPHEN 5-325 MG PO TABS
1.0000 | ORAL_TABLET | ORAL | Status: DC | PRN
  Administered 2013-09-10 – 2013-09-11 (×4): 2 via ORAL
  Filled 2013-09-09 (×4): qty 2

## 2013-09-09 MED ORDER — ALPRAZOLAM 0.25 MG PO TABS
0.2500 mg | ORAL_TABLET | Freq: Two times a day (BID) | ORAL | Status: DC | PRN
  Administered 2013-09-09 – 2013-09-10 (×2): 0.25 mg via ORAL
  Filled 2013-09-09 (×2): qty 1

## 2013-09-09 MED ORDER — ROCURONIUM BROMIDE 100 MG/10ML IV SOLN
INTRAVENOUS | Status: AC
  Filled 2013-09-09: qty 1

## 2013-09-09 MED ORDER — POLYETHYLENE GLYCOL 3350 17 G PO PACK
17.0000 g | PACK | Freq: Every day | ORAL | Status: DC | PRN
  Administered 2013-09-10: 17 g via ORAL

## 2013-09-09 MED ORDER — CEFAZOLIN SODIUM-DEXTROSE 2-3 GM-% IV SOLR
INTRAVENOUS | Status: AC
  Filled 2013-09-09: qty 50

## 2013-09-09 MED ORDER — BUPIVACAINE-EPINEPHRINE (PF) 0.25% -1:200000 IJ SOLN
INTRAMUSCULAR | Status: AC
  Filled 2013-09-09: qty 30

## 2013-09-09 MED ORDER — GLYCOPYRROLATE 0.2 MG/ML IJ SOLN
INTRAMUSCULAR | Status: AC
  Filled 2013-09-09: qty 3

## 2013-09-09 SURGICAL SUPPLY — 43 items
BAG ZIPLOCK 12X15 (MISCELLANEOUS) ×3 IMPLANT
BENZOIN TINCTURE PRP APPL 2/3 (GAUZE/BANDAGES/DRESSINGS) ×3 IMPLANT
CLEANER TIP ELECTROSURG 2X2 (MISCELLANEOUS) ×3 IMPLANT
CONT SPEC 4OZ CLIKSEAL STRL BL (MISCELLANEOUS) ×3 IMPLANT
DRAIN PENROSE 18X1/4 LTX STRL (WOUND CARE) IMPLANT
DRAPE MICROSCOPE LEICA (MISCELLANEOUS) ×3 IMPLANT
DRAPE POUCH INSTRU U-SHP 10X18 (DRAPES) ×3 IMPLANT
DRAPE SURG 17X11 SM STRL (DRAPES) ×3 IMPLANT
DRSG ADAPTIC 3X8 NADH LF (GAUZE/BANDAGES/DRESSINGS) ×3 IMPLANT
DRSG PAD ABDOMINAL 8X10 ST (GAUZE/BANDAGES/DRESSINGS) ×3 IMPLANT
DURAPREP 26ML APPLICATOR (WOUND CARE) ×3 IMPLANT
ELECT BLADE TIP CTD 4 INCH (ELECTRODE) ×3 IMPLANT
ELECT REM PT RETURN 9FT ADLT (ELECTROSURGICAL) ×3
ELECTRODE REM PT RTRN 9FT ADLT (ELECTROSURGICAL) ×1 IMPLANT
GLOVE BIOGEL PI IND STRL 8 (GLOVE) ×2 IMPLANT
GLOVE BIOGEL PI INDICATOR 8 (GLOVE) ×4
GLOVE ECLIPSE 8.0 STRL XLNG CF (GLOVE) ×6 IMPLANT
GOWN STRL REUS W/TWL LRG LVL3 (GOWN DISPOSABLE) ×9 IMPLANT
GOWN STRL REUS W/TWL XL LVL3 (GOWN DISPOSABLE) ×6 IMPLANT
KIT BASIN OR (CUSTOM PROCEDURE TRAY) ×3 IMPLANT
KIT POSITIONING SURG ANDREWS (MISCELLANEOUS) ×3 IMPLANT
MANIFOLD NEPTUNE II (INSTRUMENTS) ×3 IMPLANT
NEEDLE SPNL 18GX3.5 QUINCKE PK (NEEDLE) ×6 IMPLANT
NS IRRIG 1000ML POUR BTL (IV SOLUTION) ×3 IMPLANT
PAD ABD 8X10 STRL (GAUZE/BANDAGES/DRESSINGS) ×3 IMPLANT
PATTIES SURGICAL .5 X.5 (GAUZE/BANDAGES/DRESSINGS) IMPLANT
PATTIES SURGICAL .75X.75 (GAUZE/BANDAGES/DRESSINGS) IMPLANT
PATTIES SURGICAL 1X1 (DISPOSABLE) IMPLANT
PIN SAFETY NICK PLATE  2 MED (MISCELLANEOUS)
PIN SAFETY NICK PLATE 2 MED (MISCELLANEOUS) IMPLANT
POSITIONER SURGICAL ARM (MISCELLANEOUS) ×3 IMPLANT
SPONGE GAUZE 4X4 12PLY (GAUZE/BANDAGES/DRESSINGS) ×3 IMPLANT
SPONGE LAP 4X18 X RAY DECT (DISPOSABLE) ×3 IMPLANT
SPONGE SURGIFOAM ABS GEL 100 (HEMOSTASIS) ×3 IMPLANT
STAPLER VISISTAT 35W (STAPLE) IMPLANT
SUT VIC AB 0 CT1 27 (SUTURE) ×2
SUT VIC AB 0 CT1 27XBRD ANTBC (SUTURE) ×1 IMPLANT
SUT VIC AB 1 CT1 27 (SUTURE) ×8
SUT VIC AB 1 CT1 27XBRD ANTBC (SUTURE) ×4 IMPLANT
TAPE CLOTH SURG 6X10 WHT LF (GAUZE/BANDAGES/DRESSINGS) ×3 IMPLANT
TOWEL OR 17X26 10 PK STRL BLUE (TOWEL DISPOSABLE) ×6 IMPLANT
TRAY LAMINECTOMY (CUSTOM PROCEDURE TRAY) ×3 IMPLANT
WATER STERILE IRR 1500ML POUR (IV SOLUTION) ×3 IMPLANT

## 2013-09-09 NOTE — Anesthesia Postprocedure Evaluation (Signed)
  Anesthesia Post-op Note  Patient: Valerie Hampton  Procedure(s) Performed: Procedure(s) (LRB): LUMBAR LAMINECTOMY/DECOMPRESSION L4-5 MICRODISCECTOMY LEFT L4-5 (N/A)  Patient Location: PACU  Anesthesia Type: General  Level of Consciousness: awake and alert   Airway and Oxygen Therapy: Patient Spontanous Breathing  Post-op Pain: mild  Post-op Assessment: Post-op Vital signs reviewed, Patient's Cardiovascular Status Stable, Respiratory Function Stable, Patent Airway and No signs of Nausea or vomiting  Last Vitals:  Filed Vitals:   09/09/13 1507  BP: 108/55  Pulse: 80  Temp:   Resp: 18    Post-op Vital Signs: stable   Complications: No apparent anesthesia complications

## 2013-09-09 NOTE — H&P (Signed)
Valerie Hampton is an 73 y.o. female.   Chief Complaint: Left Leg Pain and weakness of Left Foot. HPI: Patient has not responded to conservative care and has had persistent Left leg pain with a Left Foot Drop.  Past Medical History  Diagnosis Date  . Fibromyalgia   . Chronic back pain   . Allergic rhinitis   . Osteopenia   . Intraductal papilloma 2007    no evidence of malignancy  . Occipital neuralgia   . Cataract   . PONV (postoperative nausea and vomiting)   . Family history of anesthesia complication     " sister went out ? "   . Anxiety   . Depression   . GERD (gastroesophageal reflux disease)   . Headache(784.0)     due ot facial nerve condition   . Arthritis     Past Surgical History  Procedure Laterality Date  . Abdominal hysterectomy    . Neck surgery    . Cervical spine surgery    . Breast surgery Bilateral   . Tubal ligation    . Bladder tack    . Temporal artery biopsy / ligation Right     Family History  Problem Relation Age of Onset  . Cancer - Colon Mother   . Cancer Mother     Breast    Social History:  reports that she has never smoked. She has never used smokeless tobacco. She reports that she does not drink alcohol or use illicit drugs.  Allergies:  Allergies  Allergen Reactions  . Baclofen Other (See Comments)    Reaction unknown  . Latex Other (See Comments)    Reaction unknown  . Morphine And Related Nausea And Vomiting  . Nsaids Other (See Comments)    Agitates stomach due to acid reflux  . Adhesive [Tape] Rash  . Ciprofloxacin Rash  . Iohexol Rash    Medications Prior to Admission  Medication Sig Dispense Refill  . ALPRAZolam (XANAX) 0.25 MG tablet Take 0.25 mg by mouth 2 (two) times daily as needed for anxiety.       . cyclobenzaprine (FLEXERIL) 5 MG tablet Take 1 tablet (5 mg total) by mouth 3 (three) times daily as needed for muscle spasms.  30 tablet  0  . docusate sodium (COLACE) 100 MG capsule Take 100 mg by mouth 2 (two)  times daily.       Marland Kitchen HYDROcodone-acetaminophen (NORCO/VICODIN) 5-325 MG per tablet Take 1 tablet by mouth 2 (two) times daily as needed for moderate pain.       . magnesium hydroxide (MILK OF MAGNESIA) 400 MG/5ML suspension Take 30 mLs by mouth daily as needed for mild constipation.       Marland Kitchen oxyCODONE (OXY IR/ROXICODONE) 5 MG immediate release tablet Take 1-2 tablets (5-10 mg total) by mouth every 4 (four) hours as needed for severe pain.  80 tablet  0  . polyethylene glycol (MIRALAX / GLYCOLAX) packet Take 17 g by mouth 2 (two) times daily.         Results for orders placed during the hospital encounter of 09/09/13 (from the past 48 hour(s))  URINALYSIS, ROUTINE W REFLEX MICROSCOPIC     Status: Abnormal   Collection Time    09/09/13 12:04 PM      Result Value Ref Range   Color, Urine YELLOW  YELLOW   APPearance CLEAR  CLEAR   Specific Gravity, Urine 1.011  1.005 - 1.030   pH 7.0  5.0 - 8.0  Glucose, UA NEGATIVE  NEGATIVE mg/dL   Hgb urine dipstick NEGATIVE  NEGATIVE   Bilirubin Urine NEGATIVE  NEGATIVE   Ketones, ur NEGATIVE  NEGATIVE mg/dL   Protein, ur NEGATIVE  NEGATIVE mg/dL   Urobilinogen, UA 0.2  0.0 - 1.0 mg/dL   Nitrite NEGATIVE  NEGATIVE   Leukocytes, UA TRACE (*) NEGATIVE  URINE MICROSCOPIC-ADD ON     Status: None   Collection Time    09/09/13 12:04 PM      Result Value Ref Range   Squamous Epithelial / LPF RARE  RARE   WBC, UA 0-2  <3 WBC/hpf   Bacteria, UA RARE  RARE  APTT     Status: None   Collection Time    09/09/13 12:40 PM      Result Value Ref Range   aPTT 26  24 - 37 seconds  COMPREHENSIVE METABOLIC PANEL     Status: Abnormal   Collection Time    09/09/13 12:40 PM      Result Value Ref Range   Sodium 142  137 - 147 mEq/L   Potassium 4.0  3.7 - 5.3 mEq/L   Chloride 104  96 - 112 mEq/L   CO2 26  19 - 32 mEq/L   Glucose, Bld 86  70 - 99 mg/dL   BUN 13  6 - 23 mg/dL   Creatinine, Ser 0.89  0.50 - 1.10 mg/dL   Calcium 9.3  8.4 - 10.5 mg/dL   Total  Protein 6.5  6.0 - 8.3 g/dL   Albumin 3.1 (*) 3.5 - 5.2 g/dL   AST 14  0 - 37 U/L   ALT 27  0 - 35 U/L   Alkaline Phosphatase 75  39 - 117 U/L   Total Bilirubin 0.3  0.3 - 1.2 mg/dL   GFR calc non Af Amer 64 (*) >90 mL/min   GFR calc Af Amer 74 (*) >90 mL/min   Comment: (NOTE)     The eGFR has been calculated using the CKD EPI equation.     This calculation has not been validated in all clinical situations.     eGFR's persistently <90 mL/min signify possible Chronic Kidney     Disease.  PROTIME-INR     Status: None   Collection Time    09/09/13 12:40 PM      Result Value Ref Range   Prothrombin Time 12.3  11.6 - 15.2 seconds   INR 0.93  0.00 - 1.49  TYPE AND SCREEN     Status: None   Collection Time    09/09/13 12:40 PM      Result Value Ref Range   ABO/RH(D) O POS     Antibody Screen NEG     Sample Expiration 09/12/2013    ABO/RH     Status: None   Collection Time    09/09/13 12:40 PM      Result Value Ref Range   ABO/RH(D) O POS     Dg Lumbar Spine 2-3 Views  09/09/2013   CLINICAL DATA:  Preoperative evaluation; left lower extremity radicular symptoms  EXAM: LUMBAR SPINE - 2-3 VIEW  COMPARISON:  None.  FINDINGS: There are 5 non-rib-bearing lumbar type vertebral bodies. There is no fracture or spondylolisthesis. There is moderate disc space narrowing at L4-5. There is mild disc space narrowing at L3-4 and L5-S1. No erosive change.  IMPRESSION: Areas of osteoarthritic change, most notably at L4-5. No fracture or spondylolisthesis.   Electronically Signed   By: Gwyndolyn Saxon  Jasmine December M.D.   On: 09/09/2013 13:25    Review of Systems  Constitutional: Negative.   HENT: Negative.   Eyes: Negative.   Respiratory: Negative.   Cardiovascular: Negative.   Gastrointestinal: Negative.   Genitourinary: Negative.   Musculoskeletal: Positive for back pain.  Skin: Negative.   Neurological:       Left Foot Drop  Endo/Heme/Allergies: Negative.   Psychiatric/Behavioral: Negative.      Blood pressure 108/55, pulse 80, temperature 97.7 F (36.5 C), temperature source Oral, resp. rate 18, height _0  (1.448 m), weight 50.803 kg (112 lb), SpO2 100.00%. Physical Exam  Constitutional: She appears well-developed.  HENT:  Head: Normocephalic.  Eyes: Pupils are equal, round, and reactive to light.  Neck: Normal range of motion.  Cardiovascular: Normal rate.   Respiratory: Effort normal.  GI: Soft.  Musculoskeletal:  Positive Straight Leg Raising on the Left. Left Foot Drop  Skin: Skin is warm.     Assessment/Plan Complete Lumbar Decompressive Laminectomy and Microdiscectomy at L-4-L-5.  Kipp Brood Leonard Hendler 09/09/2013, 4:13 PM

## 2013-09-09 NOTE — Brief Op Note (Signed)
09/09/2013  7:25 PM  PATIENT:  Lorna Few  73 y.o. female  PRE-OPERATIVE DIAGNOSIS:  Lumbar compression L4-5,Severe Spinal Stenosis Lumbar with Foot Drop on Left.Herniated Disc at L-4-L-5 on the Left.  POST-OPERATIVE DIAGNOSIS:  Same as Pre-Op  PROCEDURE:  Procedure(s): LUMBAR LAMINECTOMY/DECOMPRESSION L4-5 MICRODISCECTOMY LEFT L4-5 (N/A),Complete Decompressive for Spinal Stenosis.  SURGEON:  Surgeon(s) and Role:    * Tobi Bastos, MD - Primary    ASSISTANTS:James Aplington MD  ANESTHESIA:   general  EBL:  Total I/O In: -  Out: 75 [Urine:25; Blood:50]  BLOOD ADMINISTERED:none  DRAINS: none   LOCAL MEDICATIONS USED:  MARCAINE  25cc of 0.25% with Epinephrine at start of case and 20cc oc Exparel at end of case.   SPECIMEN:  Source of Specimen:  L-4-L-5 Interspace.  DISPOSITION OF SPECIMEN:  PATHOLOGY  COUNTS:  YES  TOURNIQUET:  * No tourniquets in log *  DICTATION: .Other Dictation: Dictation Number [id number]  PLAN OF CARE: Admit to inpatient   PATIENT DISPOSITION:  PACU - hemodynamically stable.   Delay start of Pharmacological VTE agent (>24hrs) due to surgical blood loss or risk of bleeding: yes

## 2013-09-09 NOTE — Transfer of Care (Signed)
Immediate Anesthesia Transfer of Care Note  Patient: Valerie Hampton  Procedure(s) Performed: Procedure(s): LUMBAR LAMINECTOMY/DECOMPRESSION L4-5 MICRODISCECTOMY LEFT L4-5 (N/A)  Patient Location: PACU  Anesthesia Type:General  Level of Consciousness: awake, sedated, patient cooperative and responds to stimulation  Airway & Oxygen Therapy: Patient Spontanous Breathing and Patient connected to face mask oxygen  Post-op Assessment: Report given to PACU RN, Post -op Vital signs reviewed and stable and Patient moving all extremities X 4  Post vital signs: Reviewed and stable  Complications: No apparent anesthesia complications

## 2013-09-09 NOTE — Progress Notes (Signed)
CARE MANAGEMENT NOTE 09/09/2013  Patient:  Valerie Hampton, Valerie Hampton   Account Number:  [id number]  Date Initiated:  09/08/2013  Documentation initiated by:  [facility]  Subjective/Objective Assessment:   back pain     Action/Plan:   Anticipated DC Date:  09/07/2013   Anticipated DC Plan:  [place]  CM consult      Choice offered to / List presented to:     DME arranged  [place]      DME agency  [facility]        Status of service:  Completed, signed off Medicare Important Message given?  NA - LOS <3 / Initial given by admissions (If response is "NO", the following Medicare IM given date fields will be blank) Date Medicare IM given:   Date Additional Medicare IM given:    Discharge Disposition:  HOME/SELF CARE  Per UR Regulation:    If discussed at Long Length of Stay Meetings, dates discussed:    Comments:  09/09/2013 0900 NCM contacted attending office and MD will sign order today after pt's surgery. Jonnie Finner RN CCM Case Mgmt phone [phone]  09/08/2013 1100 Faxed request to attending for signature of RW. Received call from [facility] and they cannot process at this time. Jonnie Finner RN CCM Case Mgmt phone [phone]  09/08/2013 0900 NCM spoke to [facility] and they do have junior RW. Faxed order to [facility] # [phone] fax [phone]. They will deliver to her home. Jonnie Finner RN CCM Case Mgmt phone [phone]  09/07/2013 1300 NCM spoke to pt and states she can use her husband's RW until she receives her RW. Explained [facility] will call her tomorrow for delivery time to home. Jonnie Finner RN CCM Case Mgmt phone [phone]

## 2013-09-09 NOTE — Interval H&P Note (Signed)
History and Physical Interval Note:  09/09/2013 4:19 PM  Valerie Hampton  has presented today for surgery, with the diagnosis of Lumbar compression L4-5  The various methods of treatment have been discussed with the patient and family. After consideration of risks, benefits and other options for treatment, the patient has consented to  Procedure(s): LUMBAR LAMINECTOMY/DECOMPRESSION L4-5 MICRODISCECTOMY LEFT L4-5 (N/A) as a surgical intervention .  The patient's history has been reviewed, patient examined, no change in status, stable for surgery.  I have reviewed the patient's chart and labs.  Questions were answered to the patient's satisfaction.     Tobi Bastos

## 2013-09-09 NOTE — Anesthesia Preprocedure Evaluation (Signed)
Anesthesia Evaluation  Patient identified by MRN, date of birth, ID band Patient awake    Reviewed: Allergy & Precautions, H&P , NPO status , Patient's Chart, lab work & pertinent test results  Airway Mallampati: II TM Distance: >3 FB Neck ROM: Full    Dental no notable dental hx.    Pulmonary neg pulmonary ROS,  breath sounds clear to auscultation  Pulmonary exam normal       Cardiovascular negative cardio ROS  Rhythm:Regular Rate:Normal     Neuro/Psych negative neurological ROS  negative psych ROS   GI/Hepatic Neg liver ROS, GERD-  Medicated,  Endo/Other  negative endocrine ROS  Renal/GU negative Renal ROS  negative genitourinary   Musculoskeletal negative musculoskeletal ROS (+)   Abdominal   Peds negative pediatric ROS (+)  Hematology negative hematology ROS (+)   Anesthesia Other Findings   Reproductive/Obstetrics negative OB ROS                           Anesthesia Physical Anesthesia Plan  ASA: II  Anesthesia Plan: General   Post-op Pain Management:    Induction: Intravenous  Airway Management Planned: Oral ETT  Additional Equipment:   Intra-op Plan:   Post-operative Plan: Extubation in OR  Informed Consent: I have reviewed the patients History and Physical, chart, labs and discussed the procedure including the risks, benefits and alternatives for the proposed anesthesia with the patient or authorized representative who has indicated his/her understanding and acceptance.   Dental advisory given  Plan Discussed with: CRNA and Surgeon  Anesthesia Plan Comments:         Anesthesia Quick Evaluation

## 2013-09-09 NOTE — Discharge Summary (Signed)
Physician Discharge Summary  Patient ID: Valerie Hampton MRN: [id number] DOB/AGE: [birth year] 72 y.o.  Admit date: 09/05/2013 Discharge date: 09/07/2013   Procedures:  None  Attending Physician:  Dr. Paralee Cancel   Admission Diagnoses:   Left leg and LBP  Discharge Diagnoses:  Active Problems:   Spinal stenosis of lumbar region  Past Medical History  Diagnosis Date  . Fibromyalgia   . Chronic back pain   . Allergic rhinitis   . Osteopenia   . Intraductal papilloma 2007    no evidence of malignancy  . Occipital neuralgia   . Cataract   . PONV (postoperative nausea and vomiting)   . Family history of anesthesia complication     " sister went out ? "   . Anxiety   . Depression   . GERD (gastroesophageal reflux disease)   . Headache(784.0)     due ot facial nerve condition   . Arthritis     HPI: 73 yo female with a history of spinal stenosis who has had a dramatic increase in left leg pain today. Patient has had a several week history of increasing low back pain since doing some yard work with her husband. She has been seen at [facility] and recently at [facility] by Drs Nelva Bush and Gladstone Lighter. She had a lumbar ESI earlier this week with brief partial relief. Starting at 3am today her pain has become much worse, especially in the left leg down below the knee. Denies bowel or bladder incontinence.  PCP: Kandice Hams, MD   Discharged Condition: good  Hospital Course:  Patient underwent the above stated procedure on 09/05/2013. Patient tolerated the procedure well and brought to the recovery room in good condition and subsequently to the floor.  Day #1 BP: 100/60 ; Pulse: 87 ; Temp: 97.7 F (36.5 C) ; Resp: 19 Patient was admitted yesterday for back pain. She is a Patient of Dr. Gladstone Lighter, who is planning on operating at some point for lumbar spinal stenosis. Pain radiates down LLE, and is worse with ambulation and dangling of left leg. No new injury. She reports she is  felling better today. No Bowel or bladder changes. Denies SOB, or CP. Alert and oriented x3. Left foot drop. Weakness to left LE. Sensation intact and 2+ pedal pulse bilateral. Not tender on palpation.  Day #2  BP: 123/71 ; Pulse: 84 ; Temp: 97.8 F (36.6 C) ; Resp: 20 Patient reports pain as mild, controlled well at this time. No events throughout the night. She is to have surgery by Dr. Gladstone Lighter soon, but waiting on insurance to approve the procedure. Feels well enough to be discharge home. Alert and oriented x3. Left foot drop. Weakness to left LE. Sensation intact and 2+ pedal pulse bilateral. Not tender on palpation.   Discharge Exam: General appearance: alert, cooperative and no distress Extremities: extremities normal, atraumatic, no cyanosis or edema  Disposition: Home with follow up in 2 weeks   Follow-up Information   Call Tobi Bastos, MD.   Specialty:  Orthopedic Surgery   Contact information:   [address] [place] [address] [phone]       Discharge Instructions   Call MD / Call 911    Complete by:  As directed   If you experience chest pain or shortness of breath, CALL 911 and be transported to the hospital emergency room.  If you develope a fever above 101 F, pus (white drainage) or increased drainage or redness at the  wound, or calf pain, call your surgeon's office.     Constipation Prevention    Complete by:  As directed   Drink plenty of fluids.  Prune juice may be helpful.  You may use a stool softener, such as Colace (over the counter) 100 mg twice a day.  Use MiraLax (over the counter) for constipation as needed.     Diet - low sodium heart healthy    Complete by:  As directed      Discharge instructions    Complete by:  As directed   Call with any questions or concerns.     Increase activity slowly as tolerated    Complete by:  As directed              Medication List         ALPRAZolam 0.25 MG tablet  Commonly  known as:  XANAX  Take 0.25 mg by mouth 2 (two) times daily as needed for anxiety.     cyclobenzaprine 5 MG tablet  Commonly known as:  FLEXERIL  Take 1 tablet (5 mg total) by mouth 3 (three) times daily as needed for muscle spasms.     HYDROcodone-acetaminophen 5-325 MG per tablet  Commonly known as:  NORCO/VICODIN  Take 1 tablet by mouth 2 (two) times daily as needed for moderate pain.     oxyCODONE 5 MG immediate release tablet  Commonly known as:  Oxy IR/ROXICODONE  Take 1-2 tablets (5-10 mg total) by mouth every 4 (four) hours as needed for severe pain.         Signed: West Pugh. Brighid Koch   PA-C  09/09/2013, 4:21 PM

## 2013-09-10 ENCOUNTER — Encounter (HOSPITAL_COMMUNITY): Payer: Self-pay | Admitting: Orthopedic Surgery

## 2013-09-10 MED ORDER — BIOTENE DRY MOUTH MT LIQD
15.0000 mL | Freq: Two times a day (BID) | OROMUCOSAL | Status: DC
  Administered 2013-09-10 – 2013-09-11 (×3): 15 mL via OROMUCOSAL

## 2013-09-10 MED ORDER — ALUM & MAG HYDROXIDE-SIMETH 200-200-20 MG/5ML PO SUSP
30.0000 mL | ORAL | Status: DC | PRN
  Administered 2013-09-10 – 2013-09-11 (×3): 30 mL via ORAL
  Filled 2013-09-10 (×3): qty 30

## 2013-09-10 NOTE — Evaluation (Signed)
Physical Therapy Evaluation Patient Details Name: Valerie Hampton MRN: [id number] DOB: Jun 03, 1940 Today's Date: 09/10/2013   History of Present Illness  73 y.o. female admitted with spinal stenosis, s/p L4-L5 lumbar laminectomy and L L4-L5 microdiscectomy  Clinical Impression  *Pt admitted with spinal stenosis, s/p back surgery**. Pt currently with functional limitations due to the deficits listed below (see PT Problem List).  Pt will benefit from skilled PT to increase their independence and safety with mobility to allow discharge to the venue listed below.   *    Follow Up Recommendations No PT follow up    Equipment Recommendations  Rolling walker with 5" wheels    Recommendations for Other Services       Precautions / Restrictions Precautions Precautions: Back Precaution Booklet Issued: Yes (comment) Precaution Comments: reviewed back precautions with pt, spouse, and daughter Restrictions Weight Bearing Restrictions: No      Mobility  Bed Mobility Overal bed mobility: Needs Assistance Bed Mobility: Sidelying to Sit   Sidelying to sit: Mod assist       General bed mobility comments: assist to raise trunk, cues for technique  Transfers Overall transfer level: Needs assistance Equipment used: Rolling walker (2 wheeled) Transfers: Sit to/from Stand Sit to Stand: Min assist         General transfer comment: cues for posture and   hand placement, assist to rise  Ambulation/Gait Ambulation/Gait assistance: Min guard Ambulation Distance (Feet): 40 Feet Assistive device: Rolling walker (2 wheeled) Gait Pattern/deviations: Step-through pattern     General Gait Details: cues for RW safety and  posture, distance limited by fatigue/pain/reflux  Stairs            Wheelchair Mobility    Modified Rankin (Stroke Patients Only)       Balance Overall balance assessment: Needs assistance Sitting-balance support: Feet unsupported Sitting balance-Leahy  Scale: Good     Standing balance support: Bilateral upper extremity supported Standing balance-Leahy Scale: Fair                               Pertinent Vitals/Pain **7/10 back pain with walking Premedicated, repositioned*    Home Living Family/patient expects to be discharged to:: Private residence Living Arrangements: Spouse/significant other Available Help at Discharge: Family;Available 24 hours/day Type of Home: House Home Access: Stairs to enter Entrance Stairs-Rails: Can reach both;Left;Right Entrance Stairs-Number of Steps: 2-3 Home Layout: One level Home Equipment: Cane - single point;Walker - 4 wheels (husband's equip) Additional Comments: Rollator is her husband's and is too tall, needs youth RW    Prior Function Level of Independence: Needs assistance   Gait / Transfers Assistance Needed: Mod I with SPC, has only used cane for a few weeks  ADL's / Homemaking Assistance Needed: husband helped with shoes and socks just for the last few weeks        Hand Dominance        Extremity/Trunk Assessment   Upper Extremity Assessment: Overall WFL for tasks assessed           Lower Extremity Assessment: LLE deficits/detail   LLE Deficits / Details: ankle DF 2+/5; sensation intact to light touch, however pt reports L foot feels a little tingly  Cervical / Trunk Assessment: Normal  Communication   Communication: No difficulties  Cognition Arousal/Alertness: Awake/alert Behavior During Therapy: WFL for tasks assessed/performed Overall Cognitive Status: Within Functional Limits for tasks assessed  General Comments      Exercises        Assessment/Plan    PT Assessment Patient needs continued PT services  PT Diagnosis Difficulty walking;Acute pain   PT Problem List Decreased strength;Decreased activity tolerance;Pain;Decreased knowledge of use of DME;Decreased mobility;Decreased knowledge of precautions  PT  Treatment Interventions Gait training;DME instruction;Therapeutic exercise;Stair training;Functional mobility training;Therapeutic activities;Patient/family education   PT Goals (Current goals can be found in the Care Plan section) Acute Rehab PT Goals Patient Stated Goal: housework, gardening PT Goal Formulation: With patient Time For Goal Achievement: 09/24/13 Potential to Achieve Goals: Good    Frequency Min 5X/week   Barriers to discharge        Co-evaluation               End of Session Equipment Utilized During Treatment: Gait belt Activity Tolerance: Patient limited by fatigue;Patient limited by pain Patient left: in chair;with call bell/phone within reach;with family/visitor present Nurse Communication: Mobility status         Time: 1683-7290 PT Time Calculation (min): 35 min   Charges:   PT Evaluation $Initial PT Evaluation Tier I: 1 Procedure PT Treatments $Gait Training: 8-22 mins $Therapeutic Activity: 8-22 mins   PT G Codes:          Philomena Doheny 09/10/2013, 11:32 AM [phone]

## 2013-09-10 NOTE — Op Note (Signed)
NAMEADREANNE, YONO NO.:  [id number]  MEDICAL RECORD NO.:  [id number]  LOCATION:  5427                         FACILITY:  [facility]  PHYSICIAN:  Kipp Brood. Karlen Barbar, M.D.DATE OF BIRTH:  11/17/1940  DATE OF PROCEDURE:  09/09/2013 DATE OF DISCHARGE:                              OPERATIVE REPORT   SURGEON:  Kipp Brood. Gladstone Lighter, M.D.  ASSISTANT:  Tarri Glenn, M.D.  PREOPERATIVE DIAGNOSES: 1. Severe complete spinal stenosis at L4-5. 2. Footdrop on the left secondary to #1. 3. Large herniated disk at L4-5 on the left.  POSTOPERATIVE DIAGNOSES: 1. Severe complete spinal stenosis at L4-5. 2. Footdrop on the left secondary to #1. 3. Large herniated disk at L4-5 on the left.  OPERATIONS: 1. Complete decompressive lumbar laminectomy at L4-L5 for spinal     stenosis. 2. Foraminotomy for the L5 root on the left for foraminal stenosis. 3. Microdiskectomy at L4-5 on the left.  Note, she had a complete footdrop on the left.  DESCRIPTION OF PROCEDURE:  Under general anesthesia with the patient on spinal frame, routine orthopedic prep and draping of the lower back was carried out.  She had 2 g of IV Ancef.  The appropriate time-out was first carried out.  I also marked the appropriate left side of the back even though we went central.  I marked that in the holding area because that is the area of the footdrop on the left.  At this time, two needles were placed in the back, x-ray was taken to verify our position. Following that, an incision was made over L4-5 interspace.  Prior to doing, I injected a mixture of 25 mL of 0.25% Marcaine with epinephrine to control bleeding.  Incision then was carried down to the lamina and the muscle was separated from the lamina spinous process in the usual fashion.  At that time, another x-ray was taken with Kocher clamps in place.  The third x-ray was taken.  Another Kocher clamp had been placed to verify the space.  I then removed the  spinous process of L4, also part of L5 distally, and part of L3 proximally.  We went down, then decompressed the dura from the posterior approach by removing the ligamentum flavum and the spinous process and the lamina.  Great care was taken to protect the dura.  The microscope was brought in.  We did protect the dura with the cottonoids and then went out laterally both sides and decompressed the recesses.  We then went down distally by use of the scope and went down, identified the L5 root.  It was severely compressed, going out the foramen.  We did a nice foraminotomy first, decompressed the lateral recess, gently retracted the root, identified the disk, and did a microdiskectomy.  Another x-ray was done prior to that.  We cauterized lateral recess veins with a bipolar.  After the microdiskectomy, we then explored the root again.  We were able to easily pass a hockey-stick out the foramen now and there were no other compression proximally or distally.  Thoroughly irrigated out the area, loosely applied some thrombin-soaked Gelfoam, closed the wound layers in usual fashion except I left a small distal deep and  proximal part of the wound open for drainage purposes.  #1 suture was used to close the subcu metal staples to close the skin.  Note, I did inject 20 mL of Exparel into the soft tissue prior to closing the skin.  Sterile Neosporin dressings were applied.  The patient left the operating room in satisfactory condition.          ______________________________ Kipp Brood Gladstone Lighter, M.D.     RAG/MEDQ  D:  09/09/2013  T:  09/10/2013  Job:  [id number]  cc:   Ashby Dawes. Polite, M.D. Fax: [phone]

## 2013-09-10 NOTE — Progress Notes (Signed)
Subjective: 1 Day Post-Op Procedure(s) (LRB): LUMBAR LAMINECTOMY/DECOMPRESSION L4-5 MICRODISCECTOMY LEFT L4-5 (N/A) Patient reports pain as 5 on 0-10 scale.Will continue to have weakness in Left foot for sometime.She had a foot drop pre-op.    Objective: Vital signs in last 24 hours: Temp:  [97.7 F (36.5 C)-98.8 F (37.1 C)] 98.8 F (37.1 C) (06/10 0537) Pulse Rate:  [80-119] 105 (06/10 0537) Resp:  [12-32] 14 (06/10 0537) BP: (108-156)/(46-87) 116/76 mmHg (06/10 0537) SpO2:  [97 %-100 %] 98 % (06/10 0537) Weight:  [50.803 kg (112 lb)] 50.803 kg (112 lb) (06/09 1222)  Intake/Output from previous day: 06/09 0701 - 06/10 0700 In: 2631.7 [P.O.:180; I.V.:2296.7; IV Piggyback:155] Out: 925 [Urine:875; Blood:50] Intake/Output this shift:    No results found for this basename: HGB,  in the last 72 hours No results found for this basename: WBC, RBC, HCT, PLT,  in the last 72 hours  Recent Labs  09/09/13 1240  NA 142  K 4.0  CL 104  CO2 26  BUN 13  CREATININE 0.89  GLUCOSE 86  CALCIUM 9.3    Recent Labs  09/09/13 1240  INR 0.93    Continues to have pre-op weakness in Left Foot.  Assessment/Plan: 1 Day Post-Op Procedure(s) (LRB): LUMBAR LAMINECTOMY/DECOMPRESSION L4-5 MICRODISCECTOMY LEFT L4-5 (N/A) Up with therapy  Matalynn Graff A 09/10/2013, 7:48 AM

## 2013-09-10 NOTE — Progress Notes (Signed)
OT Cancellation Note  Patient Details Name: Valerie Hampton MRN: [id number] DOB: [birth year]   Cancelled Treatment:    Reason Eval/Treat Not Completed: Other (comment). Have attempted eval 4x's this am and timing has not been good.  Will most likely return tomorrow am.   Morelia Cassells 09/10/2013, 12:00 PM Lesle Chris, OTR/L [phone] 09/10/2013

## 2013-09-10 NOTE — Progress Notes (Signed)
OT Cancellation Note  Patient Details Name: KIAYA HALIBURTON MRN: [id number] DOB: Oct 02, 1940   Cancelled Treatment:    Reason Eval/Treat Not Completed: Other (comment).  Pt has 7/10 pain.  Will try to return after next dose of pain medication  Bertine Schlottman 09/10/2013, 9:20 AM Lesle Chris, OTR/L [phone] 09/10/2013

## 2013-09-11 LAB — MRSA CULTURE

## 2013-09-11 MED ORDER — FLEET ENEMA 7-19 GM/118ML RE ENEM
1.0000 | ENEMA | Freq: Once | RECTAL | Status: AC
  Administered 2013-09-11: 1 via RECTAL
  Filled 2013-09-11: qty 1

## 2013-09-11 MED ORDER — OXYCODONE HCL 5 MG PO TABS: 5.0000 mg | ORAL_TABLET | ORAL | Status: DC | PRN

## 2013-09-11 MED ORDER — METHOCARBAMOL 500 MG PO TABS: 500.0000 mg | ORAL_TABLET | Freq: Four times a day (QID) | ORAL | Status: DC | PRN

## 2013-09-11 NOTE — Evaluation (Signed)
Occupational Therapy Evaluation Patient Details Name: PROVIDENCE STIVERS MRN: [id number] DOB: 06/19/1940 Today's Date: 09/11/2013    History of Present Illness 73 y.o. female admitted with spinal stenosis, s/p L4-L5 lumbar laminectomy and L L4-L5 microdiscectomy   Clinical Impression   Pt was admitted for the above surgery.  All education was completed.  Pt does not need any further OT at this time.      Follow Up Recommendations  No OT follow up    Equipment Recommendations  None recommended by OT    Recommendations for Other Services       Precautions / Restrictions Precautions Precautions: Back Precaution Booklet Issued: Yes (comment) Restrictions Weight Bearing Restrictions: No      Mobility Bed Mobility                  Transfers     Transfers: Sit to/from Stand Sit to Stand: Supervision         General transfer comment: cues for UE placement    Balance                                            ADL Overall ADL's : Needs assistance/impaired     Grooming: Set up;Sitting       Lower Body Bathing: Moderate assistance;Sit to/from stand       Lower Body Dressing: Maximal assistance;Sit to/from stand   Toilet Transfer: Min guard;Ambulation             General ADL Comments: reviewed adls and back precautions:  pt and husband verbalize understanding.  They have a reacher, and he has been helping pt prior to sx.  Pt is able to complete UB adls at set up level.  Pt up in chair:  states rolling is difficult in our bed.       Vision                     Perception     Praxis      Pertinent Vitals/Pain 5-6/10  Repositioned.  Pt is trying not to take pain medication because it aggravates her reflux.     Hand Dominance     Extremity/Trunk Assessment Upper Extremity Assessment Upper Extremity Assessment: Overall WFL for tasks assessed           Communication Communication Communication: No difficulties   Cognition Arousal/Alertness: Awake/alert Behavior During Therapy: WFL for tasks assessed/performed Overall Cognitive Status: Within Functional Limits for tasks assessed                     General Comments       Exercises       Shoulder Instructions      Home Living Family/patient expects to be discharged to:: Private residence Living Arrangements: Spouse/significant other Available Help at Discharge: Family;Available 24 hours/day Type of Home: House Home Access: Stairs to enter [employer] of Steps: 2-3 Entrance Stairs-Rails: Can reach both;Left;Right Home Layout: One level     Bathroom Shower/Tub: Tub/shower unit Shower/tub characteristics: Architectural technologist: Standard     Home Equipment: Toilet riser;Shower seat          Prior Functioning/Environment Level of Independence: Needs assistance        Comments: assist for LB adls    OT Diagnosis:     OT Problem List:     OT Treatment/Interventions:  OT Goals(Current goals can be found in the care plan section)    OT Frequency:     Barriers to D/C:            Co-evaluation              End of Session    Activity Tolerance: Patient tolerated treatment well Patient left: in chair;with call bell/phone within reach;with family/visitor present   Time: 0822-0848 OT Time Calculation (min): 26 min Charges:  OT General Charges $OT Visit: 1 Procedure OT Evaluation $Initial OT Evaluation Tier I: 1 Procedure OT Treatments $Self Care/Home Management : 8-22 mins G-Codes:    Bretta Fees 30-Sep-2013, 9:13 AM   Lesle Chris, OTR/L [phone] 09/30/13

## 2013-09-11 NOTE — Progress Notes (Signed)
   Subjective: 2 Days Post-Op Procedure(s) (LRB): LUMBAR LAMINECTOMY/DECOMPRESSION L4-5 MICRODISCECTOMY LEFT L4-5 (N/A) Patient reports pain as moderate.   Patient seen in rounds with Dr. Gladstone Lighter. Patient is having problems with constipation. Otherwise she is feeling better. She denies SOB and chest pain. No issues overnight.  Plan is to go Home after hospital stay.  Objective: Vital signs in last 24 hours: Temp:  [98.5 F (36.9 C)-99.5 F (37.5 C)] 98.5 F (36.9 C) (06/11 0419) Pulse Rate:  [103-125] 103 (06/11 0419) Resp:  [15-18] 18 (06/11 0419) BP: (96-134)/(45-80) 106/58 mmHg (06/11 0419) SpO2:  [91 %-100 %] 95 % (06/11 0419)  Intake/Output from previous day:  Intake/Output Summary (Last 24 hours) at 09/11/13 0740 Last data filed at 09/11/13 0600  Gross per 24 hour  Intake   3170 ml  Output   2750 ml  Net    420 ml     Recent Labs  09/09/13 1240  NA 142  K 4.0  CL 104  CO2 26  BUN 13  CREATININE 0.89  GLUCOSE 86  CALCIUM 9.3    Recent Labs  09/09/13 1240  INR 0.93    EXAM General - Patient is Alert and Oriented Extremity - Neurologically intact Intact pulses distally Dorsiflexion/Plantar flexion intact Compartment soft Dressing/Incision - clean, dry, no drainage Motor Function - intact, moving foot and toes well on exam.   Past Medical History  Diagnosis Date  . Fibromyalgia   . Chronic back pain   . Allergic rhinitis   . Osteopenia   . Intraductal papilloma 2007    no evidence of malignancy  . Occipital neuralgia   . Cataract   . PONV (postoperative nausea and vomiting)   . Family history of anesthesia complication     " sister went out ? "   . Anxiety   . Depression   . GERD (gastroesophageal reflux disease)   . Headache(784.0)     due ot facial nerve condition   . Arthritis     Assessment/Plan: 2 Days Post-Op Procedure(s) (LRB): LUMBAR LAMINECTOMY/DECOMPRESSION L4-5 MICRODISCECTOMY LEFT L4-5 (N/A) Active Problems:   Spinal  stenosis, lumbar region, with neurogenic claudication   Herniated lumbar intervertebral disc  Estimated body mass index is 24.23 kg/(m^2) as calculated from the following:   Height as of this encounter: 4\' 9"  (1.448 m).   Weight as of this encounter: 50.803 kg (112 lb). Advance diet Up with therapy D/C IV fluids Discharge home   DVT Prophylaxis - Aspirin Weight-Bearing as tolerated   She is doing well except for constipation. Will do fleet enema and DC home today after a session of PT as long as she has a BM.  Valerie Jourdain, PA-C Orthopaedic Surgery 09/11/2013, 7:40 AM

## 2013-09-11 NOTE — Progress Notes (Signed)
Physical Therapy Treatment Patient Details Name: Valerie Hampton MRN: [id number] DOB: April 01, 1941 Today's Date: 09/11/2013    History of Present Illness 73 y.o. female admitted with spinal stenosis, s/p L4-L5 lumbar laminectomy and L L4-L5 microdiscectomy    PT Comments    **Pt is progressing well with mobility. She walked 200' with RW and supervision today. Stair training completed. She requires mod assist for sidelying to sit, husband can assist with this at home. Reviewed back precautions. REady to DC home from PT standpoint. Per chart she needs to have a BM before she can go home, pt reports she's had an enema but hasn't passed gas yet. *  Follow Up Recommendations  No PT follow up     Equipment Recommendations  Rolling walker with 5" wheels    Recommendations for Other Services       Precautions / Restrictions Precautions Precautions: Back Precaution Booklet Issued: Yes (comment) Precaution Comments: reviewed back precautions with pt, spouse Restrictions Weight Bearing Restrictions: No    Mobility  Bed Mobility Overal bed mobility: Needs Assistance Bed Mobility: Sidelying to Sit   Sidelying to sit: Mod assist       General bed mobility comments: assist to raise trunk, cues for technique  Transfers Overall transfer level: Needs assistance Equipment used: Rolling walker (2 wheeled) Transfers: Sit to/from Stand Sit to Stand: Supervision         General transfer comment: cues for posture and   hand placement  Ambulation/Gait Ambulation/Gait assistance: Supervision Ambulation Distance (Feet): 200 Feet Assistive device: Rolling walker (2 wheeled) Gait Pattern/deviations: Step-through pattern     General Gait Details: verbal cues for positioning in RW and for posture (neck flexed)   Stairs Stairs: Yes Stairs assistance: Supervision Stair Management: Two rails;Alternating pattern;Forwards Number of Stairs: 3    Wheelchair Mobility    Modified  Rankin (Stroke Patients Only)       Balance                                    Cognition Arousal/Alertness: Awake/alert Behavior During Therapy: WFL for tasks assessed/performed Overall Cognitive Status: Within Functional Limits for tasks assessed                      Exercises      General Comments        Pertinent Vitals/Pain **5/10 back with mobility Pt reported itching from tape on dressing, RN notified*    Home Living Family/patient expects to be discharged to:: Private residence Living Arrangements: Spouse/significant other Available Help at Discharge: Family;Available 24 hours/day Type of Home: House Home Access: Stairs to enter Entrance Stairs-Rails: Can reach both;Left;Right Home Layout: One level Home Equipment: Toilet riser;Shower seat      Prior Function Level of Independence: Needs assistance      Comments: assist for LB adls   PT Goals (current goals can now be found in the care plan section) Acute Rehab PT Goals Patient Stated Goal: housework, gardening PT Goal Formulation: With patient Time For Goal Achievement: 09/24/13 Potential to Achieve Goals: Good Progress towards PT goals: Progressing toward goals    Frequency  Min 5X/week    PT Plan Current plan remains appropriate    Co-evaluation             End of Session Equipment Utilized During Treatment: Gait belt Activity Tolerance: Patient tolerated treatment well Patient left: with call bell/phone  within reach;with family/visitor present;in bed     Time: 1035-1101 PT Time Calculation (min): 26 min  Charges:  $Gait Training: 8-22 mins $Therapeutic Activity: 8-22 mins                    G Codes:      Philomena Doheny 09/11/2013, 11:09 AM [phone]

## 2013-09-12 NOTE — Discharge Summary (Signed)
Physician Discharge Summary   Patient ID: Valerie Hampton MRN: [id number] DOB/AGE: 1940/05/11 73 y.o.  Admit date: 09/09/2013 Discharge date: 09/11/2013  Primary Diagnosis: Lumbar spinal stenosis    Lumbar disc herniation, L4-L5 Admission Diagnoses:  Past Medical History  Diagnosis Date  . Fibromyalgia   . Chronic back pain   . Allergic rhinitis   . Osteopenia   . Intraductal papilloma 2007    no evidence of malignancy  . Occipital neuralgia   . Cataract   . PONV (postoperative nausea and vomiting)   . Family history of anesthesia complication     " sister went out ? "   . Anxiety   . Depression   . GERD (gastroesophageal reflux disease)   . Headache(784.0)     due ot facial nerve condition   . Arthritis    Discharge Diagnoses:   Active Problems:   Spinal stenosis, lumbar region, with neurogenic claudication   Herniated lumbar intervertebral disc  Estimated body mass index is 24.23 kg/(m^2) as calculated from the following:   Height as of this encounter: 4' 9" (1.448 m).   Weight as of this encounter: 50.803 kg (112 lb).  Procedure:  Procedure(s) (LRB): LUMBAR LAMINECTOMY/DECOMPRESSION L4-5 MICRODISCECTOMY LEFT L4-5 (N/A)   Consults: None  HPI: 73 yo female with a history of spinal stenosis who has had a dramatic increase in left leg pain this weekend. Patient has had a several week history of increasing low back pain since doing some yard work with her husband. She has been seen at [facility] and recently at [facility] by Drs Nelva Bush and Gladstone Lighter. She had a lumbar ESI earlier this week with brief partial relief. Starting at 3am Saturday her pain has become much worse, especially in the left leg down below the knee. Denies bowel or bladder incontinence.   Laboratory Data: Admission on 09/09/2013, Discharged on 09/11/2013  Component Date Value Ref Range Status  . aPTT 09/09/2013 26  24 - 37 seconds Final  . Sodium 09/09/2013 142  137 - 147 mEq/L Final  . Potassium  09/09/2013 4.0  3.7 - 5.3 mEq/L Final  . Chloride 09/09/2013 104  96 - 112 mEq/L Final  . CO2 09/09/2013 26  19 - 32 mEq/L Final  . Glucose, Bld 09/09/2013 86  70 - 99 mg/dL Final  . BUN 09/09/2013 13  6 - 23 mg/dL Final  . Creatinine, Ser 09/09/2013 0.89  0.50 - 1.10 mg/dL Final  . Calcium 09/09/2013 9.3  8.4 - 10.5 mg/dL Final  . Total Protein 09/09/2013 6.5  6.0 - 8.3 g/dL Final  . Albumin 09/09/2013 3.1* 3.5 - 5.2 g/dL Final  . AST 09/09/2013 14  0 - 37 U/L Final  . ALT 09/09/2013 27  0 - 35 U/L Final  . Alkaline Phosphatase 09/09/2013 75  39 - 117 U/L Final  . Total Bilirubin 09/09/2013 0.3  0.3 - 1.2 mg/dL Final  . GFR calc non Af Amer 09/09/2013 64* >90 mL/min Final  . GFR calc Af Amer 09/09/2013 74* >90 mL/min Final   Comment: (NOTE)                          The eGFR has been calculated using the CKD EPI equation.                          This calculation has not been validated in all clinical situations.  eGFR's persistently <90 mL/min signify possible Chronic Kidney                          Disease.  Marland Kitchen Prothrombin Time 09/09/2013 12.3  11.6 - 15.2 seconds Final  . INR 09/09/2013 0.93  0.00 - 1.49 Final  . ABO/RH(D) 09/09/2013 O POS   Final  . Antibody Screen 09/09/2013 NEG   Final  . Sample Expiration 09/09/2013 09/12/2013   Final  . Color, Urine 09/09/2013 YELLOW  YELLOW Final  . APPearance 09/09/2013 CLEAR  CLEAR Final  . Specific Gravity, Urine 09/09/2013 1.011  1.005 - 1.030 Final  . pH 09/09/2013 7.0  5.0 - 8.0 Final  . Glucose, UA 09/09/2013 NEGATIVE  NEGATIVE mg/dL Final  . Hgb urine dipstick 09/09/2013 NEGATIVE  NEGATIVE Final  . Bilirubin Urine 09/09/2013 NEGATIVE  NEGATIVE Final  . Ketones, ur 09/09/2013 NEGATIVE  NEGATIVE mg/dL Final  . Protein, ur 09/09/2013 NEGATIVE  NEGATIVE mg/dL Final  . Urobilinogen, UA 09/09/2013 0.2  0.0 - 1.0 mg/dL Final  . Nitrite 09/09/2013 NEGATIVE  NEGATIVE Final  . Leukocytes, UA 09/09/2013 TRACE* NEGATIVE  Final  . Squamous Epithelial / LPF 09/09/2013 RARE  RARE Final  . WBC, UA 09/09/2013 0-2  <3 WBC/hpf Final  . Bacteria, UA 09/09/2013 RARE  RARE Final  . ABO/RH(D) 09/09/2013 O POS   Final  . Specimen Description 09/09/2013 NOSE   Final  . Special Requests 09/09/2013 NONE   Final  . Culture 09/09/2013    Final                   Value:FEW METHICILLIN RESISTANT STAPHYLOCOCCUS AUREUS                         Note: CRITICAL RESULT CALLED TO, READ BACK BY AND VERIFIED WITH: DANA F 6/11 _0  BY REAMM                         Performed at [facility]  . Report Status 09/09/2013 09/11/2013 FINAL   Final  Admission on 09/05/2013, Discharged on 09/07/2013  Component Date Value Ref Range Status  . Sodium 09/06/2013 142  137 - 147 mEq/L Final  . Potassium 09/06/2013 5.3  3.7 - 5.3 mEq/L Final  . Chloride 09/06/2013 105  96 - 112 mEq/L Final  . CO2 09/06/2013 25  19 - 32 mEq/L Final  . Glucose, Bld 09/06/2013 143* 70 - 99 mg/dL Final  . BUN 09/06/2013 17  6 - 23 mg/dL Final  . Creatinine, Ser 09/06/2013 0.88  0.50 - 1.10 mg/dL Final  . Calcium 09/06/2013 9.4  8.4 - 10.5 mg/dL Final  . GFR calc non Af Amer 09/06/2013 65* >90 mL/min Final  . GFR calc Af Amer 09/06/2013 75* >90 mL/min Final   Comment: (NOTE)                          The eGFR has been calculated using the CKD EPI equation.                          This calculation has not been validated in all clinical situations.                          eGFR's persistently <90 mL/min signify possible Chronic  Kidney                          Disease.  . WBC 09/06/2013 7.8  4.0 - 10.5 K/uL Final  . RBC 09/06/2013 4.18  3.87 - 5.11 MIL/uL Final  . Hemoglobin 09/06/2013 12.6  12.0 - 15.0 g/dL Final  . HCT 09/06/2013 37.5  36.0 - 46.0 % Final  . MCV 09/06/2013 89.7  78.0 - 100.0 fL Final  . MCH 09/06/2013 30.1  26.0 - 34.0 pg Final  . MCHC 09/06/2013 33.6  30.0 - 36.0 g/dL Final  . RDW 09/06/2013 13.2  11.5 - 15.5 % Final  . Platelets  09/06/2013 303  150 - 400 K/uL Final  . Neutrophils Relative % 09/06/2013 90* 43 - 77 % Final  . Neutro Abs 09/06/2013 7.0  1.7 - 7.7 K/uL Final  . Lymphocytes Relative 09/06/2013 10* 12 - 46 % Final  . Lymphs Abs 09/06/2013 0.8  0.7 - 4.0 K/uL Final  . Monocytes Relative 09/06/2013 0* 3 - 12 % Final  . Monocytes Absolute 09/06/2013 0.0* 0.1 - 1.0 K/uL Final  . Eosinophils Relative 09/06/2013 0  0 - 5 % Final  . Eosinophils Absolute 09/06/2013 0.0  0.0 - 0.7 K/uL Final  . Basophils Relative 09/06/2013 0  0 - 1 % Final  . Basophils Absolute 09/06/2013 0.0  0.0 - 0.1 K/uL Final  . Prothrombin Time 09/06/2013 12.2  11.6 - 15.2 seconds Final  . INR 09/06/2013 0.92  0.00 - 1.49 Final  . Sodium 09/07/2013 141  137 - 147 mEq/L Final  . Potassium 09/07/2013 4.4  3.7 - 5.3 mEq/L Final   DELTA CHECK NOTED  . Chloride 09/07/2013 104  96 - 112 mEq/L Final  . CO2 09/07/2013 27  19 - 32 mEq/L Final  . Glucose, Bld 09/07/2013 96  70 - 99 mg/dL Final  . BUN 09/07/2013 20  6 - 23 mg/dL Final  . Creatinine, Ser 09/07/2013 0.95  0.50 - 1.10 mg/dL Final  . Calcium 09/07/2013 9.5  8.4 - 10.5 mg/dL Final  . GFR calc non Af Amer 09/07/2013 59* >90 mL/min Final  . GFR calc Af Amer 09/07/2013 68* >90 mL/min Final   Comment: (NOTE)                          The eGFR has been calculated using the CKD EPI equation.                          This calculation has not been validated in all clinical situations.                          eGFR's persistently <90 mL/min signify possible Chronic Kidney                          Disease.     X-Rays:Dg Chest 2 View  09/06/2013   CLINICAL DATA:  Preoperative evaluation prior to back surgery.  EXAM: CHEST  2 VIEW  COMPARISON:  Chest x-ray 07/21/2005.  FINDINGS: Lung volumes are normal. No consolidative airspace disease. No pleural effusions. No pneumothorax. No pulmonary nodule or mass noted. Pulmonary vasculature and the cardiomediastinal silhouette are within normal limits.  Orthopedic fixation hardware in the lower cervical spine incompletely imaged.  IMPRESSION: No radiographic evidence of acute cardiopulmonary  disease.   Electronically Signed   By: Vinnie Langton M.D.   On: 09/06/2013 15:24   Dg Lumbar Spine 2-3 Views  09/09/2013   CLINICAL DATA:  Preoperative evaluation; left lower extremity radicular symptoms  EXAM: LUMBAR SPINE - 2-3 VIEW  COMPARISON:  None.  FINDINGS: There are 5 non-rib-bearing lumbar type vertebral bodies. There is no fracture or spondylolisthesis. There is moderate disc space narrowing at L4-5. There is mild disc space narrowing at L3-4 and L5-S1. No erosive change.  IMPRESSION: Areas of osteoarthritic change, most notably at L4-5. No fracture or spondylolisthesis.   Electronically Signed   By: Lowella Grip M.D.   On: 09/09/2013 13:25   Dg Spine Portable 1 View  09/09/2013   CLINICAL DATA:  Surgery.  EXAM: PORTABLE SPINE - 1 VIEW  COMPARISON:  Radiography from the same day at 610 pm.  FINDINGS: Surgical instruments present posteriorly at the L4-5 disc level. No new osseous findings.  IMPRESSION: Surgical localization at the L4-5 level.   Electronically Signed   By: Jorje Guild M.D.   On: 09/09/2013 18:39   Dg Spine Portable 1 View  09/09/2013   CLINICAL DATA:  Back pain  EXAM: PORTABLE SPINE - 1 VIEW  COMPARISON:  Multiple priors.  FINDINGS: Surgically posterior approach overlies the spinous process of L4.  IMPRESSION: L4 marked.   Electronically Signed   By: Rolla Flatten M.D.   On: 09/09/2013 18:34   Dg Spine Portable 1 View  09/09/2013   CLINICAL DATA:  Back pain.  EXAM: PORTABLE SPINE - 1 VIEW  COMPARISON:  Lumbar spine films performed earlier in the day.  FINDINGS: Intraoperative portable lateral film 1 demonstrates needles from a posterior approach overlying the L4 and L5 spinous processes.  IMPRESSION: As above.   Electronically Signed   By: Rolla Flatten M.D.   On: 09/09/2013 18:05    EKG: Orders placed during the hospital  encounter of 09/09/13  . EKG 12-LEAD  . EKG 12-LEAD  . EKG 12-LEAD  . EKG 12-LEAD     Hospital Course: Valerie Hampton is a 73 y.o. who was admitted to [facility]. They were brought to the operating room on 09/09/2013 and underwent Procedure(s): LUMBAR LAMINECTOMY/DECOMPRESSION L4-5 MICRODISCECTOMY LEFT L4-5.  Patient tolerated the procedure well and was later transferred to the recovery room and then to the orthopaedic floor for postoperative care.  They were given PO and IV analgesics for pain control following their surgery.  They were given 24 hours of postoperative antibiotics of  Anti-infectives   Start     Dose/Rate Route Frequency Ordered Stop   09/10/13 0600  ceFAZolin (ANCEF) IVPB 2 g/50 mL premix     2 g 100 mL/hr over 30 Minutes Intravenous On call to O.R. 09/09/13 1203 09/09/13 1725   09/10/13 0100  ceFAZolin (ANCEF) IVPB 1 g/50 mL premix     1 g 100 mL/hr over 30 Minutes Intravenous 3 times per day 09/09/13 2156 09/10/13 1500     and started on DVT prophylaxis in the form of Aspirin.   PT was ordered for gait training.  Discharge planning consulted to help with postop disposition and equipment needs.  Patient had a fair night on the evening of surgery but was limited with therapy early due to how late she arrived onto the floor from surgery.  They started to get up OOB with therapy on day one. Continued to work with therapy into day two.  Dressing was changed on  day two and the incision was clean and dry. Incision was healing well.  Patient was seen in rounds and was ready to go home.   Diet: Regular diet Activity:WBAT Follow-up:in 2 weeks Disposition - Home Discharged Condition: stable   Discharge Instructions   Call MD / Call 911    Complete by:  As directed   If you experience chest pain or shortness of breath, CALL 911 and be transported to the hospital emergency room.  If you develope a fever above 101 F, pus (white drainage) or increased drainage or redness  at the wound, or calf pain, call your surgeon's office.     Constipation Prevention    Complete by:  As directed   Drink plenty of fluids.  Prune juice may be helpful.  You may use a stool softener, such as Colace (over the counter) 100 mg twice a day.  Use MiraLax (over the counter) for constipation as needed.     Diet general    Complete by:  As directed      Discharge instructions    Complete by:  As directed   Change your dressing daily. Shower only, no tub bath. Call if any temperatures greater than 101 or any wound complications: [phone] during the day and ask for Dr. Charlestine Night nurse, Brunilda Payor.     Driving restrictions    Complete by:  As directed   No driving     Increase activity slowly as tolerated    Complete by:  As directed      Lifting restrictions    Complete by:  As directed   No lifting            Medication List    STOP taking these medications       cyclobenzaprine 5 MG tablet  Commonly known as:  FLEXERIL     HYDROcodone-acetaminophen 5-325 MG per tablet  Commonly known as:  NORCO/VICODIN      TAKE these medications       ALPRAZolam 0.25 MG tablet  Commonly known as:  XANAX  Take 0.25 mg by mouth 2 (two) times daily as needed for anxiety.     docusate sodium 100 MG capsule  Commonly known as:  COLACE  Take 100 mg by mouth 2 (two) times daily.     magnesium hydroxide 400 MG/5ML suspension  Commonly known as:  MILK OF MAGNESIA  Take 30 mLs by mouth daily as needed for mild constipation.     methocarbamol 500 MG tablet  Commonly known as:  ROBAXIN  Take 1 tablet (500 mg total) by mouth every 6 (six) hours as needed for muscle spasms.     oxyCODONE 5 MG immediate release tablet  Commonly known as:  Oxy IR/ROXICODONE  Take 1-3 tablets (5-15 mg total) by mouth every 4 (four) hours as needed for severe pain.     polyethylene glycol packet  Commonly known as:  MIRALAX / GLYCOLAX  Take 17 g by mouth 2 (two) times daily.             Follow-up Information   Follow up with GIOFFRE,RONALD A, MD In 2 weeks.   Specialty:  Orthopedic Surgery   Contact information:   [address] [place] [address] [phone]       Signed: Ardeen Jourdain, PA-C Orthopaedic Surgery 09/12/2013, 10:21 AM

## 2013-09-16 NOTE — ED Provider Notes (Signed)
Medical screening examination/treatment/procedure(s) were conducted as a shared visit with non-physician practitioner(s) and myself.  I personally evaluated the patient during the encounter.   EKG Interpretation None      Patient seen and evaluated. History was reviewed. Patient describes pain and radicular fashion on her left leg. Does not have obvious footdrop your. However, her abnormalities on MRI would suggest possibility of radiculopathy, and functional neurological loss.  Care was discussed between [facility], and Dr. Veverly Fells.  Patient will be admitted. Appreciate Dr. Gilberto Better input and care on this patient.  Tanna Furry, MD 09/16/13 2352

## 2013-10-16 ENCOUNTER — Ambulatory Visit: Payer: Commercial Managed Care - HMO | Admitting: Nurse Practitioner

## 2013-10-23 ENCOUNTER — Other Ambulatory Visit: Payer: Self-pay

## 2013-10-23 DIAGNOSIS — Z1231 Encounter for screening mammogram for malignant neoplasm of breast: Secondary | ICD-10-CM

## 2014-02-02 ENCOUNTER — Encounter (HOSPITAL_COMMUNITY): Payer: Self-pay | Admitting: Orthopedic Surgery

## 2014-02-13 ENCOUNTER — Ambulatory Visit
Admission: RE | Admit: 2014-02-13 | Discharge: 2014-02-13 | Disposition: A | Discharge status: Home / Self Care | Payer: Medicare HMO | Source: Ambulatory Visit

## 2014-02-13 DIAGNOSIS — Z1231 Encounter for screening mammogram for malignant neoplasm of breast: Secondary | ICD-10-CM

## 2014-02-18 ENCOUNTER — Encounter: Payer: Self-pay | Admitting: Neurology

## 2014-02-24 ENCOUNTER — Encounter: Payer: Self-pay | Admitting: Neurology

## 2014-08-20 ENCOUNTER — Other Ambulatory Visit: Payer: Self-pay | Admitting: Internal Medicine

## 2014-08-20 DIAGNOSIS — M8580 Other specified disorders of bone density and structure, unspecified site: Secondary | ICD-10-CM

## 2014-08-26 ENCOUNTER — Ambulatory Visit
Admission: RE | Admit: 2014-08-26 | Discharge: 2014-08-26 | Disposition: A | Discharge status: Home / Self Care | Payer: Commercial Managed Care - HMO | Source: Ambulatory Visit | Attending: Internal Medicine | Admitting: Internal Medicine

## 2014-08-26 DIAGNOSIS — M8580 Other specified disorders of bone density and structure, unspecified site: Secondary | ICD-10-CM

## 2014-12-09 ENCOUNTER — Other Ambulatory Visit: Payer: Self-pay

## 2014-12-09 DIAGNOSIS — Z1231 Encounter for screening mammogram for malignant neoplasm of breast: Secondary | ICD-10-CM

## 2015-02-15 ENCOUNTER — Ambulatory Visit: Payer: Commercial Managed Care - HMO

## 2015-03-03 ENCOUNTER — Ambulatory Visit
Admission: RE | Admit: 2015-03-03 | Discharge: 2015-03-03 | Disposition: A | Discharge status: Home / Self Care | Payer: Commercial Managed Care - HMO | Source: Ambulatory Visit

## 2015-03-03 DIAGNOSIS — Z1231 Encounter for screening mammogram for malignant neoplasm of breast: Secondary | ICD-10-CM

## 2015-03-05 ENCOUNTER — Other Ambulatory Visit: Payer: Self-pay | Admitting: Internal Medicine

## 2015-03-05 DIAGNOSIS — R928 Other abnormal and inconclusive findings on diagnostic imaging of breast: Secondary | ICD-10-CM

## 2015-03-12 ENCOUNTER — Ambulatory Visit
Admission: RE | Admit: 2015-03-12 | Discharge: 2015-03-12 | Disposition: A | Discharge status: Home / Self Care | Payer: Commercial Managed Care - HMO | Source: Ambulatory Visit | Attending: Internal Medicine | Admitting: Internal Medicine

## 2015-03-12 DIAGNOSIS — R928 Other abnormal and inconclusive findings on diagnostic imaging of breast: Secondary | ICD-10-CM

## 2015-03-23 ENCOUNTER — Emergency Department (HOSPITAL_COMMUNITY): Payer: Commercial Managed Care - HMO

## 2015-03-23 ENCOUNTER — Inpatient Hospital Stay (HOSPITAL_COMMUNITY)
Admission: EM | Admit: 2015-03-23 | Discharge: 2015-03-25 | DRG: 419 | Disposition: A | Discharge status: Home / Self Care | Payer: Commercial Managed Care - HMO | Attending: General Surgery | Admitting: General Surgery

## 2015-03-23 ENCOUNTER — Other Ambulatory Visit: Payer: Self-pay

## 2015-03-23 ENCOUNTER — Emergency Department (HOSPITAL_COMMUNITY)
Admission: EM | Admit: 2015-03-23 | Discharge: 2015-03-23 | Disposition: A | Discharge status: Home / Self Care | Payer: Commercial Managed Care - HMO | Source: Home / Self Care | Attending: Emergency Medicine | Admitting: Emergency Medicine

## 2015-03-23 ENCOUNTER — Encounter (HOSPITAL_COMMUNITY): Payer: Self-pay | Admitting: Emergency Medicine

## 2015-03-23 ENCOUNTER — Ambulatory Visit (HOSPITAL_COMMUNITY)
Admit: 2015-03-23 | Discharge: 2015-03-23 | Disposition: A | Discharge status: Home / Self Care | Payer: Commercial Managed Care - HMO | Attending: Emergency Medicine | Admitting: Emergency Medicine

## 2015-03-23 ENCOUNTER — Encounter (HOSPITAL_COMMUNITY): Payer: Self-pay

## 2015-03-23 DIAGNOSIS — F329 Major depressive disorder, single episode, unspecified: Secondary | ICD-10-CM | POA: Insufficient documentation

## 2015-03-23 DIAGNOSIS — Z9851 Tubal ligation status: Secondary | ICD-10-CM

## 2015-03-23 DIAGNOSIS — K8050 Calculus of bile duct without cholangitis or cholecystitis without obstruction: Secondary | ICD-10-CM

## 2015-03-23 DIAGNOSIS — Z91040 Latex allergy status: Secondary | ICD-10-CM

## 2015-03-23 DIAGNOSIS — R1011 Right upper quadrant pain: Secondary | ICD-10-CM | POA: Insufficient documentation

## 2015-03-23 DIAGNOSIS — M1990 Unspecified osteoarthritis, unspecified site: Secondary | ICD-10-CM

## 2015-03-23 DIAGNOSIS — Z881 Allergy status to other antibiotic agents status: Secondary | ICD-10-CM | POA: Diagnosis not present

## 2015-03-23 DIAGNOSIS — Z91048 Other nonmedicinal substance allergy status: Secondary | ICD-10-CM

## 2015-03-23 DIAGNOSIS — Z90710 Acquired absence of both cervix and uterus: Secondary | ICD-10-CM | POA: Insufficient documentation

## 2015-03-23 DIAGNOSIS — Z883 Allergy status to other anti-infective agents status: Secondary | ICD-10-CM

## 2015-03-23 DIAGNOSIS — K8020 Calculus of gallbladder without cholecystitis without obstruction: Secondary | ICD-10-CM

## 2015-03-23 DIAGNOSIS — Z79899 Other long term (current) drug therapy: Secondary | ICD-10-CM

## 2015-03-23 DIAGNOSIS — R110 Nausea: Secondary | ICD-10-CM | POA: Insufficient documentation

## 2015-03-23 DIAGNOSIS — Z885 Allergy status to narcotic agent status: Secondary | ICD-10-CM | POA: Diagnosis not present

## 2015-03-23 DIAGNOSIS — Z8669 Personal history of other diseases of the nervous system and sense organs: Secondary | ICD-10-CM

## 2015-03-23 DIAGNOSIS — M797 Fibromyalgia: Secondary | ICD-10-CM | POA: Diagnosis present

## 2015-03-23 DIAGNOSIS — K8000 Calculus of gallbladder with acute cholecystitis without obstruction: Secondary | ICD-10-CM | POA: Diagnosis not present

## 2015-03-23 DIAGNOSIS — Z91041 Radiographic dye allergy status: Secondary | ICD-10-CM

## 2015-03-23 DIAGNOSIS — G8929 Other chronic pain: Secondary | ICD-10-CM | POA: Insufficient documentation

## 2015-03-23 DIAGNOSIS — F419 Anxiety disorder, unspecified: Secondary | ICD-10-CM

## 2015-03-23 DIAGNOSIS — Z87891 Personal history of nicotine dependence: Secondary | ICD-10-CM

## 2015-03-23 DIAGNOSIS — K219 Gastro-esophageal reflux disease without esophagitis: Secondary | ICD-10-CM

## 2015-03-23 DIAGNOSIS — Z886 Allergy status to analgesic agent status: Secondary | ICD-10-CM | POA: Diagnosis not present

## 2015-03-23 DIAGNOSIS — Z888 Allergy status to other drugs, medicaments and biological substances status: Secondary | ICD-10-CM | POA: Diagnosis not present

## 2015-03-23 DIAGNOSIS — I10 Essential (primary) hypertension: Secondary | ICD-10-CM | POA: Diagnosis not present

## 2015-03-23 DIAGNOSIS — K8010 Calculus of gallbladder with chronic cholecystitis without obstruction: Secondary | ICD-10-CM | POA: Diagnosis present

## 2015-03-23 DIAGNOSIS — J069 Acute upper respiratory infection, unspecified: Secondary | ICD-10-CM

## 2015-03-23 LAB — COMPREHENSIVE METABOLIC PANEL
ALK PHOS: 78 U/L (ref 38–126)
ALT: 14 U/L (ref 14–54)
ALT: 15 U/L (ref 14–54)
ANION GAP: 11 (ref 5–15)
AST: 18 U/L (ref 15–41)
AST: 16 U/L (ref 15–41)
Albumin: 3.8 g/dL (ref 3.5–5.0)
Albumin: 3.6 g/dL (ref 3.5–5.0)
Alkaline Phosphatase: 87 U/L (ref 38–126)
Anion gap: 7 (ref 5–15)
BUN: 12 mg/dL (ref 6–20)
BUN: 17 mg/dL (ref 6–20)
CALCIUM: 9.3 mg/dL (ref 8.9–10.3)
CALCIUM: 9.6 mg/dL (ref 8.9–10.3)
CHLORIDE: 104 mmol/L (ref 101–111)
CO2: 26 mmol/L (ref 22–32)
CO2: 26 mmol/L (ref 22–32)
CREATININE: 0.89 mg/dL (ref 0.44–1.00)
Chloride: 106 mmol/L (ref 101–111)
Creatinine, Ser: 0.86 mg/dL (ref 0.44–1.00)
Glucose, Bld: 113 mg/dL — ABNORMAL HIGH (ref 65–99)
Glucose, Bld: 86 mg/dL (ref 65–99)
POTASSIUM: 3.6 mmol/L (ref 3.5–5.1)
Potassium: 3.9 mmol/L (ref 3.5–5.1)
SODIUM: 141 mmol/L (ref 135–145)
Sodium: 139 mmol/L (ref 135–145)
TOTAL PROTEIN: 7.4 g/dL (ref 6.5–8.1)
Total Bilirubin: 0.5 mg/dL (ref 0.3–1.2)
Total Bilirubin: 0.4 mg/dL (ref 0.3–1.2)
Total Protein: 6.9 g/dL (ref 6.5–8.1)

## 2015-03-23 LAB — CBC WITH DIFFERENTIAL/PLATELET
BASOS ABS: 0 10*3/uL (ref 0.0–0.1)
BASOS PCT: 0 %
Basophils Absolute: 0 10*3/uL (ref 0.0–0.1)
Basophils Relative: 1 %
EOS ABS: 0.1 10*3/uL (ref 0.0–0.7)
EOS PCT: 1 %
Eosinophils Absolute: 0.1 10*3/uL (ref 0.0–0.7)
Eosinophils Relative: 2 %
HEMATOCRIT: 36.8 % (ref 36.0–46.0)
HEMATOCRIT: 37.5 % (ref 36.0–46.0)
HEMOGLOBIN: 12.4 g/dL (ref 12.0–15.0)
Hemoglobin: 12.6 g/dL (ref 12.0–15.0)
LYMPHS ABS: 2.2 10*3/uL (ref 0.7–4.0)
LYMPHS ABS: 2.3 10*3/uL (ref 0.7–4.0)
LYMPHS PCT: 42 %
Lymphocytes Relative: 35 %
MCH: 30.4 pg (ref 26.0–34.0)
MCH: 30.5 pg (ref 26.0–34.0)
MCHC: 33.6 g/dL (ref 30.0–36.0)
MCHC: 33.7 g/dL (ref 30.0–36.0)
MCV: 90.6 fL (ref 78.0–100.0)
MCV: 90.6 fL (ref 78.0–100.0)
MONO ABS: 0.7 10*3/uL (ref 0.1–1.0)
MONOS PCT: 12 %
Monocytes Absolute: 0.5 10*3/uL (ref 0.1–1.0)
Monocytes Relative: 9 %
NEUTROS ABS: 2.6 10*3/uL (ref 1.7–7.7)
NEUTROS ABS: 3.2 10*3/uL (ref 1.7–7.7)
NEUTROS PCT: 47 %
Neutrophils Relative %: 51 %
Platelets: 261 10*3/uL (ref 150–400)
Platelets: 255 10*3/uL (ref 150–400)
RBC: 4.14 MIL/uL (ref 3.87–5.11)
RBC: 4.06 MIL/uL (ref 3.87–5.11)
RDW: 12.7 % (ref 11.5–15.5)
RDW: 12.8 % (ref 11.5–15.5)
WBC: 6.3 10*3/uL (ref 4.0–10.5)
WBC: 5.5 10*3/uL (ref 4.0–10.5)

## 2015-03-23 LAB — URINE MICROSCOPIC-ADD ON: RBC / HPF: NONE SEEN RBC/hpf (ref 0–5)

## 2015-03-23 LAB — URINALYSIS, ROUTINE W REFLEX MICROSCOPIC
BILIRUBIN URINE: NEGATIVE
Glucose, UA: NEGATIVE mg/dL
HGB URINE DIPSTICK: NEGATIVE
KETONES UR: 15 mg/dL — AB
NITRITE: NEGATIVE
PH: 5.5 (ref 5.0–8.0)
Protein, ur: NEGATIVE mg/dL
Specific Gravity, Urine: 1.025 (ref 1.005–1.030)

## 2015-03-23 LAB — MRSA PCR SCREENING: MRSA BY PCR: POSITIVE — AB

## 2015-03-23 LAB — LIPASE, BLOOD
LIPASE: 36 U/L (ref 11–51)
Lipase: 59 U/L — ABNORMAL HIGH (ref 11–51)

## 2015-03-23 MED ORDER — FENTANYL CITRATE (PF) 100 MCG/2ML IJ SOLN
25.0000 ug | Freq: Once | INTRAMUSCULAR | Status: AC
  Administered 2015-03-23: 25 ug via INTRAVENOUS
  Filled 2015-03-23: qty 2

## 2015-03-23 MED ORDER — ONDANSETRON HCL 4 MG/2ML IJ SOLN
4.0000 mg | Freq: Once | INTRAMUSCULAR | Status: AC
  Administered 2015-03-23: 4 mg via INTRAVENOUS
  Filled 2015-03-23: qty 2

## 2015-03-23 MED ORDER — SODIUM CHLORIDE 0.9 % IV BOLUS (SEPSIS)
500.0000 mL | Freq: Once | INTRAVENOUS | Status: AC
  Administered 2015-03-23: 500 mL via INTRAVENOUS

## 2015-03-23 MED ORDER — ONDANSETRON HCL 4 MG PO TABS: 4.0000 mg | ORAL_TABLET | Freq: Three times a day (TID) | ORAL | Status: DC | PRN

## 2015-03-23 MED ORDER — SODIUM CHLORIDE 0.9 % IV SOLN
INTRAVENOUS | Status: DC
  Administered 2015-03-23: 01:00:00 via INTRAVENOUS

## 2015-03-23 MED ORDER — FAMOTIDINE IN NACL 20-0.9 MG/50ML-% IV SOLN
20.0000 mg | Freq: Once | INTRAVENOUS | Status: AC
  Administered 2015-03-23: 20 mg via INTRAVENOUS
  Filled 2015-03-23: qty 50

## 2015-03-23 MED ORDER — HEPARIN SODIUM (PORCINE) 5000 UNIT/ML IJ SOLN
5000.0000 [IU] | Freq: Three times a day (TID) | INTRAMUSCULAR | Status: DC
  Filled 2015-03-23: qty 1

## 2015-03-23 MED ORDER — ACETAMINOPHEN 650 MG RE SUPP: 650.0000 mg | Freq: Four times a day (QID) | RECTAL | Status: DC | PRN

## 2015-03-23 MED ORDER — PIPERACILLIN-TAZOBACTAM 3.375 G IVPB
3.3750 g | Freq: Three times a day (TID) | INTRAVENOUS | Status: DC
  Administered 2015-03-24 – 2015-03-25 (×4): 3.375 g via INTRAVENOUS
  Filled 2015-03-23 (×5): qty 50

## 2015-03-23 MED ORDER — PNEUMOCOCCAL VAC POLYVALENT 25 MCG/0.5ML IJ INJ: 0.5000 mL | INJECTION | INTRAMUSCULAR | Status: DC

## 2015-03-23 MED ORDER — MUPIROCIN 2 % EX OINT
1.0000 "application " | TOPICAL_OINTMENT | Freq: Two times a day (BID) | CUTANEOUS | Status: DC
  Administered 2015-03-23 – 2015-03-25 (×4): 1 via NASAL
  Filled 2015-03-23: qty 22

## 2015-03-23 MED ORDER — ALPRAZOLAM 0.25 MG PO TABS
0.2500 mg | ORAL_TABLET | Freq: Two times a day (BID) | ORAL | Status: DC | PRN
  Administered 2015-03-23: 0.25 mg via ORAL
  Filled 2015-03-23: qty 1

## 2015-03-23 MED ORDER — PANTOPRAZOLE SODIUM 40 MG PO TBEC: 40.0000 mg | DELAYED_RELEASE_TABLET | Freq: Every day | ORAL | Status: DC

## 2015-03-23 MED ORDER — ONDANSETRON HCL 4 MG PO TABS: 4.0000 mg | ORAL_TABLET | Freq: Four times a day (QID) | ORAL | Status: DC | PRN

## 2015-03-23 MED ORDER — FENTANYL CITRATE (PF) 100 MCG/2ML IJ SOLN: 12.5000 ug | Freq: Once | INTRAMUSCULAR | Status: DC

## 2015-03-23 MED ORDER — FENTANYL CITRATE (PF) 100 MCG/2ML IJ SOLN
50.0000 ug | Freq: Once | INTRAMUSCULAR | Status: AC
  Administered 2015-03-23: 50 ug via INTRAVENOUS
  Filled 2015-03-23: qty 2

## 2015-03-23 MED ORDER — MORPHINE SULFATE (PF) 2 MG/ML IV SOLN: 0.5000 mg | INTRAVENOUS | Status: DC | PRN

## 2015-03-23 MED ORDER — HYDROCODONE-ACETAMINOPHEN 5-325 MG PO TABS
1.0000 | ORAL_TABLET | ORAL | Status: DC | PRN
  Administered 2015-03-23 – 2015-03-25 (×3): 1 via ORAL
  Filled 2015-03-23 (×2): qty 1
  Filled 2015-03-23: qty 2

## 2015-03-23 MED ORDER — ONDANSETRON HCL 4 MG/2ML IJ SOLN: 4.0000 mg | Freq: Four times a day (QID) | INTRAMUSCULAR | Status: DC | PRN

## 2015-03-23 MED ORDER — ACETAMINOPHEN 325 MG PO TABS: 650.0000 mg | ORAL_TABLET | Freq: Four times a day (QID) | ORAL | Status: DC | PRN

## 2015-03-23 MED ORDER — SODIUM CHLORIDE 0.9 % IV SOLN
INTRAVENOUS | Status: DC
  Administered 2015-03-23 – 2015-03-25 (×4): via INTRAVENOUS

## 2015-03-23 MED ORDER — CHLORHEXIDINE GLUCONATE CLOTH 2 % EX PADS
6.0000 | MEDICATED_PAD | Freq: Every day | CUTANEOUS | Status: DC
  Administered 2015-03-25: 6 via TOPICAL

## 2015-03-23 MED ORDER — OMEPRAZOLE 20 MG PO CPDR: DELAYED_RELEASE_CAPSULE | ORAL | Status: DC

## 2015-03-23 MED ORDER — BOOST / RESOURCE BREEZE PO LIQD
1.0000 | Freq: Three times a day (TID) | ORAL | Status: DC
  Administered 2015-03-23 – 2015-03-24 (×2): 1 via ORAL

## 2015-03-23 MED ORDER — METHOCARBAMOL 500 MG PO TABS
500.0000 mg | ORAL_TABLET | Freq: Four times a day (QID) | ORAL | Status: DC | PRN
  Filled 2015-03-23: qty 1

## 2015-03-23 MED ORDER — ENSURE ENLIVE PO LIQD: 237.0000 mL | Freq: Two times a day (BID) | ORAL | Status: DC

## 2015-03-23 NOTE — H&P (Signed)
[facility]          History and Physical    PCP:   Kandice Hams, MD   EDP: Julianne Rice, M.D.  Chief Complaint:  Abdominal pain  HPI: Patient is a 74 year old woman with past medical history significant for GERD,fibromyalgia who presents to the hospital today with abdominal pain. The pain first started yesterday at 9:30 PM she describes it as right upper quadrant pain that moved into her back was severe in nature. She came to the hospital and was discharged home with plans for an outpatient abdominal ultrasound which was performed this morning. Ultrasound showed evidence for cholelithiasis and possible cholecystitis and she was referred back to the hospital for admission.Dr. Arnoldo Morale has seen the patient and is recommending admission for cholecystectomy in a.m. She is afebrile, no leukocytosis, pain currently resolved. On further questioning she can recall at least 2 distinct similar episodes the first stating back to about 5 months.  Allergies:   Allergies  Allergen Reactions  . Baclofen Other (See Comments)    Reaction unknown  . Contrast Media [Iodinated Diagnostic Agents]     rash  . Latex Other (See Comments)    Reaction unknown  . Morphine And Related Nausea And Vomiting  . Neosporin [Neomycin-Bacitracin Zn-Polymyx]     Rash, blisters   . Neosporin [Neomycin-Polymyxin-Gramicidin] Other (See Comments)    Blistering  . Nsaids Other (See Comments)    Agitates stomach due to acid reflux  . Adhesive [Tape] Rash  . Buspar [Buspirone] Anxiety  . Carbamazepine Anxiety  . Ciprofloxacin Rash  . Iohexol Rash  . Oxycodone Rash  . Relafen [Nabumetone] Anxiety  . Zoloft [Sertraline Hcl] Anxiety      Past Medical History  Diagnosis Date  . Fibromyalgia   . Chronic back pain   . Allergic rhinitis   . Osteopenia   . Intraductal papilloma 2007    no evidence of malignancy  . Occipital neuralgia   . Cataract   . PONV (postoperative nausea and  vomiting)   . Family history of anesthesia complication     " sister went out ? "   . Anxiety   . Depression   . GERD (gastroesophageal reflux disease)   . Headache(784.0)     due ot facial nerve condition   . Arthritis     Past Surgical History  Procedure Laterality Date  . Abdominal hysterectomy    . Neck surgery    . Cervical spine surgery    . Breast surgery Bilateral   . Tubal ligation    . Bladder tack    . Temporal artery biopsy / ligation Right   . Lumbar laminectomy/decompression microdiscectomy N/A 09/09/2013    Procedure: LUMBAR LAMINECTOMY/DECOMPRESSION L4-5 MICRODISCECTOMY LEFT L4-5;  Surgeon: Tobi Bastos, MD;  Location: [facility];  Service: Orthopedics;  Laterality: N/A;    Prior to Admission medications   Medication Sig Start Date End Date Taking? Authorizing Provider  acetaminophen (TYLENOL) 500 MG tablet Take 1,000 mg by mouth every 6 (six) hours as needed for fever.   Yes Historical Provider, MD  ALPRAZolam (XANAX) 0.25 MG tablet Take 0.25 mg by mouth 2 (two) times daily as needed for anxiety.    Yes Historical Provider, MD  Chlorpheniramine-DM (CORICIDIN HBP COUGH/COLD PO) Take 1 capsule by mouth every 4 (four) hours as needed (cold).   Yes Historical Provider, MD  HYDROcodone-acetaminophen (NORCO/VICODIN) 5-325 MG tablet Take  1 tablet by mouth every 6 (six) hours as needed for moderate pain.   Yes Historical Provider, MD  omeprazole (PRILOSEC) 40 MG capsule Take 1 capsule by mouth daily. 03/15/15  Yes Historical Provider, MD  phenylephrine (SUDAFED PE) 10 MG TABS tablet Take 10 mg by mouth at bedtime.    Yes Historical Provider, MD  polyethylene glycol (MIRALAX / GLYCOLAX) packet Take 17 g by mouth daily.    Yes Historical Provider, MD  triamcinolone cream (KENALOG) 0.5 % Apply 1 application topically 3 (three) times daily. 03/22/15  Yes Historical Provider, MD  docusate sodium (COLACE) 100 MG capsule Take 100 mg by mouth 2 (two) times daily.     Historical  Provider, MD  magnesium hydroxide (MILK OF MAGNESIA) 400 MG/5ML suspension Take 30 mLs by mouth daily as needed for mild constipation.     Historical Provider, MD  methocarbamol (ROBAXIN) 500 MG tablet Take 1 tablet (500 mg total) by mouth every 6 (six) hours as needed for muscle spasms. 09/11/13   Amber Cecilio Asper, PA-C  omeprazole (PRILOSEC) 20 MG capsule Take 1 po BID x 2 weeks then once a day Patient not taking: Reported on 03/23/2015 03/23/15   Rolland Porter, MD  ondansetron (ZOFRAN) 4 MG tablet Take 1 tablet (4 mg total) by mouth every 8 (eight) hours as needed. 03/23/15   Rolland Porter, MD  oxyCODONE (OXY IR/ROXICODONE) 5 MG immediate release tablet Take 1-3 tablets (5-15 mg total) by mouth every 4 (four) hours as needed for severe pain. 09/11/13   Ardeen Jourdain, PA-C    Social History:  reports that she has quit smoking. Her smoking use included Cigarettes. She has never used smokeless tobacco. She reports that she does not drink alcohol or use illicit drugs.  Family History  Problem Relation Age of Onset  . Cancer - Colon Mother   . Cancer Mother     Breast     Review of Systems:  Constitutional: Denies fever, chills, diaphoresis, appetite change and fatigue.  HEENT: Denies photophobia, eye pain, redness, hearing loss, ear pain, congestion, sore throat, rhinorrhea, sneezing, mouth sores, trouble swallowing, neck pain, neck stiffness and tinnitus.   Respiratory: Denies SOB, DOE, cough, chest tightness,  and wheezing.   Cardiovascular: Denies chest pain, palpitations and leg swelling.  Gastrointestinal: Denies nausea, vomiting, diarrhea, constipation, blood in stool and abdominal distention.  Genitourinary: Denies dysuria, urgency, frequency, hematuria, flank pain and difficulty urinating.  Endocrine: Denies: hot or cold intolerance, sweats, changes in hair or nails, polyuria, polydipsia. Musculoskeletal: Denies myalgias, back pain, joint swelling, arthralgias and gait problem.  Skin:  Denies pallor, rash and wound.  Neurological: Denies dizziness, seizures, syncope, weakness, light-headedness, numbness and headaches.  Hematological: Denies adenopathy. Easy bruising, personal or family bleeding history  Psychiatric/Behavioral: Denies suicidal ideation, mood changes, confusion, nervousness, sleep disturbance and agitation   Physical Exam: Blood pressure 147/79, pulse 70, temperature 97.9 F (36.6 C), temperature source Oral, resp. rate 18, height _0  (1.448 m), weight 45.36 kg (100 lb), SpO2 100 %. General: Alert, awake, oriented 3. HEENT: Normocephalic, atraumatic, pupils equal and reactive to light, extraocular movements intact, moist mucous membranes Neck: Supple, no JVD, no lymphadenopathy, no bruits, no goiter. Cardiovascular: Regular rate and rhythm, no murmurs, rubs or gallops Lungs: Clear auscultation bilaterally Abdomen: Soft, tender to palpation to the epigastric and right upper quadrant area, positive bowel sounds, no rigidity or guarding Extremities: No clubbing, cyanosis or edema, positive pulses Neurologic: Grossly intact and nonfocal  Labs on Admission:  Results for orders placed or performed during the hospital encounter of 03/23/15 (from the past 48 hour(s))  CBC with Differential/Platelet     Status: None   Collection Time: 03/23/15 11:52 AM  Result Value Ref Range   WBC 5.5 4.0 - 10.5 K/uL   RBC 4.06 3.87 - 5.11 MIL/uL   Hemoglobin 12.4 12.0 - 15.0 g/dL   HCT 36.8 36.0 - 46.0 %   MCV 90.6 78.0 - 100.0 fL   MCH 30.5 26.0 - 34.0 pg   MCHC 33.7 30.0 - 36.0 g/dL   RDW 12.8 11.5 - 15.5 %   Platelets 255 150 - 400 K/uL   Neutrophils Relative % 47 %   Neutro Abs 2.6 1.7 - 7.7 K/uL   Lymphocytes Relative 42 %   Lymphs Abs 2.3 0.7 - 4.0 K/uL   Monocytes Relative 9 %   Monocytes Absolute 0.5 0.1 - 1.0 K/uL   Eosinophils Relative 1 %   Eosinophils Absolute 0.1 0.0 - 0.7 K/uL   Basophils Relative 1 %   Basophils Absolute 0.0 0.0 - 0.1 K/uL   Comprehensive metabolic panel     Status: None   Collection Time: 03/23/15 11:52 AM  Result Value Ref Range   Sodium 139 135 - 145 mmol/L   Potassium 3.9 3.5 - 5.1 mmol/L   Chloride 106 101 - 111 mmol/L   CO2 26 22 - 32 mmol/L   Glucose, Bld 86 65 - 99 mg/dL   BUN 12 6 - 20 mg/dL   Creatinine, Ser 0.89 0.44 - 1.00 mg/dL   Calcium 9.3 8.9 - 10.3 mg/dL   Total Protein 6.9 6.5 - 8.1 g/dL   Albumin 3.6 3.5 - 5.0 g/dL   AST 16 15 - 41 U/L   ALT 14 14 - 54 U/L   Alkaline Phosphatase 78 38 - 126 U/L   Total Bilirubin 0.4 0.3 - 1.2 mg/dL   GFR calc non Af Amer >60 >60 mL/min   GFR calc Af Amer >60 >60 mL/min    Comment: (NOTE) The eGFR has been calculated using the CKD EPI equation. This calculation has not been validated in all clinical situations. eGFR's persistently <60 mL/min signify possible Chronic Kidney Disease.    Anion gap 7 5 - 15  Lipase, blood     Status: None   Collection Time: 03/23/15 11:52 AM  Result Value Ref Range   Lipase 36 11 - 51 U/L    Radiological Exams on Admission: Ct Abdomen Pelvis Wo Contrast  03/23/2015  CLINICAL DATA:  Right upper quadrant flank pain since yesterday. Iodine allergy EXAM: CT ABDOMEN AND PELVIS WITHOUT CONTRAST TECHNIQUE: Multidetector CT imaging of the abdomen and pelvis was performed following the standard protocol without IV contrast. COMPARISON:  09/15/2011 FINDINGS: Lower chest and abdominal wall:  No contributory findings. Hepatobiliary: No focal liver abnormality.No evidence of biliary stone. Pancreas: Unremarkable. Spleen: Unremarkable. Adrenals/Urinary Tract: Negative adrenals. No hydronephrosis or stone. Unremarkable bladder. Reproductive:Hysterectomy with negative adnexa Stomach/Bowel:  No obstruction. No appendicitis. Vascular/Lymphatic: No acute vascular abnormality. No mass or adenopathy. Peritoneal: No ascites or pneumoperitoneum. Musculoskeletal: Lumbar degenerative disc narrowing and bulging with L4-5 laminectomy. IMPRESSION:  No acute finding or explanation for symptoms. Electronically Signed   By: Monte Fantasia M.D.   On: 03/23/2015 02:01   Dg Chest 2 View  03/23/2015  CLINICAL DATA:  Cough for several days.  Low-grade fever EXAM: CHEST  2 VIEW COMPARISON:  09/16/2013 FINDINGS: Normal heart size and mediastinal contours. No acute infiltrate  or edema. No effusion or pneumothorax. No acute osseous findings. IMPRESSION: No active cardiopulmonary disease. Electronically Signed   By: Monte Fantasia M.D.   On: 03/23/2015 01:54   US Abdomen Limited Ruq/gall Gladder  03/23/2015  CLINICAL DATA:  Patient with right upper quadrant pain radiating to the right flank for 3 days. EXAM: US ABDOMEN LIMITED - RIGHT UPPER QUADRANT COMPARISON:  CT abdomen pelvis earlier same date. FINDINGS: Gallbladder: Multiple mobile gallstones are demonstrated within the gallbladder lumen. The gallbladder wall is thickened with associated edema. Negative sonographic Murphy's sign. Common bile duct: Diameter: 7 mm Liver: No focal lesion identified. Within normal limits in parenchymal echogenicity. IMPRESSION: Multiple mobile gallstones demonstrated within the gallbladder lumen with associated gallbladder wall thickening. Negative sonographic Murphy's sign. Findings are nonspecific however may represent acute cholecystitis in the appropriate clinical setting. Consider correlation with HIDA scan. Prominent common bile duct.  Consider correlation with LFTs. These results will be called to the ordering clinician or representative by the Radiologist Assistant, and communication documented in the PACS or zVision Dashboard. Electronically Signed   By: Lovey Newcomer M.D.   On: 03/23/2015 10:42    Assessment/Plan Principal Problem:   Biliary colic Active Problems:   Cholelithiasis   Acute calculous cholecystitis    Acute cholecystitis, cholelithiasis, biliary colic -Admit to MedSurg bed, as needed antiemetics, pain medication, IV fluids. -Dr. Arnoldo Morale to  perform a cholecystectomy in a.m.  GERD -Continue PPI  DVT prophylaxis -Subcutaneous heparin  CODE STATUS -Full code   Time Spent on Admission: 75 minutes  HERNANDEZ ACOSTA,ESTELA [facility] Pager: [phone] 03/23/2015, 5:05 PM

## 2015-03-23 NOTE — Discharge Instructions (Signed)
Do not eat or drink after you  leave the ED until after you have your ultrasound later this morning. Go to radiology at 10 AM to sign in for your  Ultrasound test that is scheduled at 10:15 AM. You will get the result before you leave the hospital by the morning emergency physician. You can take the hydrocodone you have at home for pain if needed. If you have gallstones, call Dr. Arnoldo Morale office, the surgeon in [place], to discuss having surgery to have your gallbladder removed. If you do not have gallstones consider seeing a gastroenterologist. You have seen [facility] in the past or you can see Dr.Rourk, who is in [place]. If you do not have gallstones increase your omeprazole to twice a day for 2 weeks then back down to once a day. Avoid fried, spicy, or greasy foods regardless what the ultrasound shows for the next couple of weeks. Return to the ED if you get worse again.    Upper Respiratory Infection, Adult Most upper respiratory infections (URIs) are a viral infection of the air passages leading to the lungs. A URI affects the nose, throat, and upper air passages. The most common type of URI is nasopharyngitis and is typically referred to as "the common cold." URIs run their course and usually go away on their own. Most of the time, a URI does not require medical attention, but sometimes a bacterial infection in the upper airways can follow a viral infection. This is called a secondary infection. Sinus and middle ear infections are common types of secondary upper respiratory infections. Bacterial pneumonia can also complicate a URI. A URI can worsen asthma and chronic obstructive pulmonary disease (COPD). Sometimes, these complications can require emergency medical care and may be life threatening.  CAUSES Almost all URIs are caused by viruses. A virus is a type of germ and can spread from one person to another.  RISKS FACTORS You may be at risk for a URI if:   You smoke.    You have chronic heart or lung disease.  You have a weakened defense (immune) system.   You are very young or very old.   You have nasal allergies or asthma.  You work in crowded or poorly ventilated areas.  You work in health care facilities or schools. SIGNS AND SYMPTOMS  Symptoms typically develop 2-3 days after you come in contact with a cold virus. Most viral URIs last 7-10 days. However, viral URIs from the influenza virus (flu virus) can last 14-18 days and are typically more severe. Symptoms may include:   Runny or stuffy (congested) nose.   Sneezing.   Cough.   Sore throat.   Headache.   Fatigue.   Fever.   Loss of appetite.   Pain in your forehead, behind your eyes, and over your cheekbones (sinus pain).  Muscle aches.  DIAGNOSIS  Your health care provider may diagnose a URI by:  Physical exam.  Tests to check that your symptoms are not due to another condition such as:  Strep throat.  Sinusitis.  Pneumonia.  Asthma. TREATMENT  A URI goes away on its own with time. It cannot be cured with medicines, but medicines may be prescribed or recommended to relieve symptoms. Medicines may help:  Reduce your fever.  Reduce your cough.  Relieve nasal congestion. HOME CARE INSTRUCTIONS   Take medicines only as directed by your health care provider.   Gargle warm saltwater or take cough drops to comfort your throat as directed  by your health care provider.  Use a warm mist humidifier or inhale steam from a shower to increase air moisture. This may make it easier to breathe.  Drink enough fluid to keep your urine clear or pale yellow.   Eat soups and other clear broths and maintain good nutrition.   Rest as needed.   Return to work when your temperature has returned to normal or as your health care provider advises. You may need to stay home longer to avoid infecting others. You can also use a face mask and careful hand washing to  prevent spread of the virus.  Increase the usage of your inhaler if you have asthma.   Do not use any tobacco products, including cigarettes, chewing tobacco, or electronic cigarettes. If you need help quitting, ask your health care provider. PREVENTION  The best way to protect yourself from getting a cold is to practice good hygiene.   Avoid oral or hand contact with people with cold symptoms.   Wash your hands often if contact occurs.  There is no clear evidence that vitamin C, vitamin E, echinacea, or exercise reduces the chance of developing a cold. However, it is always recommended to get plenty of rest, exercise, and practice good nutrition.  SEEK MEDICAL CARE IF:   You are getting worse rather than better.   Your symptoms are not controlled by medicine.   You have chills.  You have worsening shortness of breath.  You have brown or red mucus.  You have yellow or brown nasal discharge.  You have pain in your face, especially when you bend forward.  You have a fever.  You have swollen neck glands.  You have pain while swallowing.  You have white areas in the back of your throat. SEEK IMMEDIATE MEDICAL CARE IF:   You have severe or persistent:  Headache.  Ear pain.  Sinus pain.  Chest pain.  You have chronic lung disease and any of the following:  Wheezing.  Prolonged cough.  Coughing up blood.  A change in your usual mucus.  You have a stiff neck.  You have changes in your:  Vision.  Hearing.  Thinking.  Mood. MAKE SURE YOU:   Understand these instructions.  Will watch your condition.  Will get help right away if you are not doing well or get worse.   This information is not intended to replace advice given to you by your health care provider. Make sure you discuss any questions you have with your health care provider.   Document Released: 09/13/2000 Document Revised: 08/04/2014 Document Reviewed: 06/25/2013 Elsevier Interactive  Patient Education 2016 Elsevier Inc.   Nausea, Adult Nausea means you feel sick to your stomach or need to throw up (vomit). It may be a sign of a more serious problem. If nausea gets worse, you may throw up. If you throw up a lot, you may lose too much body fluid (dehydration). HOME CARE   Get plenty of rest.  Ask your doctor how to replace body fluid losses (rehydrate).  Eat small amounts of food. Sip liquids more often.  Take all medicines as told by your doctor. GET HELP RIGHT AWAY IF:  You have a fever.  You pass out (faint).  You keep throwing up or have blood in your throw up.  You are very weak, have dry lips or a dry mouth, or you are very thirsty (dehydrated).  You have dark or bloody poop (stool).  You have very bad chest  or belly (abdominal) pain.  You do not get better after 2 days, or you get worse.  You have a headache. MAKE SURE YOU:  Understand these instructions.  Will watch your condition.  Will get help right away if you are not doing well or get worse.   This information is not intended to replace advice given to you by your health care provider. Make sure you discuss any questions you have with your health care provider.   Document Released: 03/09/2011 Document Revised: 06/12/2011 Document Reviewed: 03/09/2011 Elsevier Interactive Patient Education [employer].

## 2015-03-23 NOTE — ED Notes (Signed)
Pt c/o intermittent rt flank pain since Saturday.

## 2015-03-23 NOTE — Progress Notes (Signed)
**Note De-Identified Valerie Hampton Obfuscation** EKG results reported to Dr. Jerilee Hoh.

## 2015-03-23 NOTE — Progress Notes (Signed)
ANTIBIOTIC CONSULT NOTE - INITIAL  Pharmacy Consult for zosyn Indication: intra-abdominal infection  Allergies  Allergen Reactions  . Baclofen Other (See Comments)    Reaction unknown  . Contrast Media [Iodinated Diagnostic Agents]     rash  . Latex Other (See Comments)    Reaction unknown  . Morphine And Related Nausea And Vomiting  . Neosporin [Neomycin-Bacitracin Zn-Polymyx]     Rash, blisters   . Neosporin [Neomycin-Polymyxin-Gramicidin] Other (See Comments)    Blistering  . Nsaids Other (See Comments)    Agitates stomach due to acid reflux  . Adhesive [Tape] Rash  . Buspar [Buspirone] Anxiety  . Carbamazepine Anxiety  . Ciprofloxacin Rash  . Iohexol Rash  . Oxycodone Rash  . Relafen [Nabumetone] Anxiety  . Zoloft [Sertraline Hcl] Anxiety    Patient Measurements: Height: 4\' 9"  (144.8 cm) Weight: 100 lb (45.36 kg) IBW/kg (Calculated) : 38.6   Vital Signs: Temp: 97.9 F (36.6 C) (12/20 1300) Temp Source: Oral (12/20 1114) BP: 147/79 mmHg (12/20 1300) Pulse Rate: 70 (12/20 1300) Intake/Output from previous day:   Intake/Output from this shift:    Labs:  Recent Labs  03/23/15 0048 03/23/15 1152  WBC 6.3 5.5  HGB 12.6 12.4  PLT 261 255  CREATININE 0.86 0.89   Estimated Creatinine Clearance: 34.8 mL/min (by C-G formula based on Cr of 0.89). No results for input(s): VANCOTROUGH, VANCOPEAK, VANCORANDOM, GENTTROUGH, GENTPEAK, GENTRANDOM, TOBRATROUGH, TOBRAPEAK, TOBRARND, AMIKACINPEAK, AMIKACINTROU, AMIKACIN in the last 72 hours.   Microbiology: No results found for this or any previous visit (from the past 720 hour(s)).  Medical History: Past Medical History  Diagnosis Date  . Fibromyalgia   . Chronic back pain   . Allergic rhinitis   . Osteopenia   . Intraductal papilloma 2007    no evidence of malignancy  . Occipital neuralgia   . Cataract   . PONV (postoperative nausea and vomiting)   . Family history of anesthesia complication     " sister  went out ? "   . Anxiety   . Depression   . GERD (gastroesophageal reflux disease)   . Headache(784.0)     due ot facial nerve condition   . Arthritis     Medications:  See medication history Assessment: 74 yo lady to start zosyn for intra-abdominal infection.  Goal of Therapy:  Eradication of infection  Plan:  Zosyn 3.375 gm IV q8 hours F/u renal function, cultures and clinical course  Thanks for allowing pharmacy to be a part of this patient's care.  Excell Seltzer, PharmD Clinical Pharmacist  03/23/2015,5:40 PM

## 2015-03-23 NOTE — ED Provider Notes (Signed)
CSN: [id number]     Arrival date & time 03/23/15  0002 History  By signing my name below, I, Helane Gunther, attest that this documentation has been prepared under the direction and in the presence of Rolland Porter, MD at [phone]. Electronically Signed: Helane Gunther, ED Scribe. 03/23/2015. 12:45 AM.    Chief Complaint  Patient presents with  . Flank Pain   The history is provided by the patient. No language interpreter was used.   HPI Comments: Valerie Hampton is a 74 y.o. female who presents to the Emergency Department complaining of intermittent, worsening, right-sided RUQ pain that started in her epigastric area and is now radiating towards the back onset 3 days ago. She notes that on the first day, it started after taking liquid robitussin DM,  the pain lasted for about an hour, and then resolved until tonight. She notes she ate a fish sandwich about 6 hours ago or about 3 hours before the pain started at 9 pm tonight. She reports associated nausea. She notes exacerbation of the pain with lying down on her right side. She took rolaids the first day and it resolved, but tonight it did not help. She states that for the past few days she has been feeling nauseated after eating. She notes she has been having cold-like symptoms for the past few days with productive cough (green sputum), rhinorrhea, fever (Tmax 99.7), and sore throat. She notes she took some robitussin and Rolaids the first day with sufficient relief. She started taking coricidin yesterday, Dec 18. She notes she takes Miralax daily. She also states she takes allergy medication and Xanax every night at 9 PM, including tonight. She denies smoking and drinking alcohol. Pt denies SOB and melena.   PCP Dr Carney Living  Past Medical History  Diagnosis Date  . Fibromyalgia   . Chronic back pain   . Allergic rhinitis   . Osteopenia   . Intraductal papilloma 2007    no evidence of malignancy  . Occipital neuralgia   . Cataract   . PONV  (postoperative nausea and vomiting)   . Family history of anesthesia complication     " sister went out ? "   . Anxiety   . Depression   . GERD (gastroesophageal reflux disease)   . Headache(784.0)     due ot facial nerve condition   . Arthritis    Past Surgical History  Procedure Laterality Date  . Abdominal hysterectomy    . Neck surgery    . Cervical spine surgery    . Breast surgery Bilateral   . Tubal ligation    . Bladder tack    . Temporal artery biopsy / ligation Right   . Lumbar laminectomy/decompression microdiscectomy N/A 09/09/2013    Procedure: LUMBAR LAMINECTOMY/DECOMPRESSION L4-5 MICRODISCECTOMY LEFT L4-5;  Surgeon: Tobi Bastos, MD;  Location: [facility];  Service: Orthopedics;  Laterality: N/A;   Family History  Problem Relation Age of Onset  . Cancer - Colon Mother   . Cancer Mother     Breast    Social History  Substance Use Topics  . Smoking status: Never Smoker   . Smokeless tobacco: Never Used  . Alcohol Use: No   Lives at home Lives with spouse  OB History    Gravida Para Term Preterm AB TAB SAB Ectopic Multiple Living   7 4 4  3  3   3      Review of Systems  Respiratory: Negative for shortness of breath.  Gastrointestinal: Positive for nausea and abdominal pain. Negative for blood in stool.  Genitourinary: Positive for flank pain.  All other systems reviewed and are negative.   Allergies  Baclofen; Latex; Morphine and related; Nsaids; Adhesive; Ciprofloxacin; and Iohexol  Home Medications   Prior to Admission medications   Medication Sig Start Date End Date Taking? Authorizing Provider  ALPRAZolam (XANAX) 0.25 MG tablet Take 0.25 mg by mouth 2 (two) times daily as needed for anxiety.    Yes Historical Provider, MD  docusate sodium (COLACE) 100 MG capsule Take 100 mg by mouth 2 (two) times daily.    Yes Historical Provider, MD  HYDROcodone-acetaminophen (NORCO/VICODIN) 5-325 MG tablet Take 1 tablet by mouth every 6 (six) hours as needed  for moderate pain.   Yes Historical Provider, MD  magnesium hydroxide (MILK OF MAGNESIA) 400 MG/5ML suspension Take 30 mLs by mouth daily as needed for mild constipation.    Yes Historical Provider, MD  phenylephrine (SUDAFED PE) 10 MG TABS tablet Take 10 mg by mouth every 4 (four) hours as needed.   Yes Historical Provider, MD  polyethylene glycol (MIRALAX / GLYCOLAX) packet Take 17 g by mouth 2 (two) times daily.    Yes Historical Provider, MD  methocarbamol (ROBAXIN) 500 MG tablet Take 1 tablet (500 mg total) by mouth every 6 (six) hours as needed for muscle spasms. 09/11/13   Amber Cecilio Asper, PA-C  oxyCODONE (OXY IR/ROXICODONE) 5 MG immediate release tablet Take 1-3 tablets (5-15 mg total) by mouth every 4 (four) hours as needed for severe pain. 09/11/13   Amber Constable, PA-C   BP 183/75 mmHg  Pulse 74  Temp(Src) 97.4 F (36.3 C) (Oral)  Resp 24  Ht 4\' 9"  (1.448 m)  Wt 100 lb (45.36 kg)  BMI 21.63 kg/m2  SpO2 98%  Vital signs normal except hypertension  Physical Exam  Constitutional: She is oriented to person, place, and time. She appears well-developed and well-nourished.  Non-toxic appearance. She does not appear ill. No distress.  HENT:  Head: Normocephalic and atraumatic.  Right Ear: External ear normal.  Left Ear: External ear normal.  Nose: Nose normal. No mucosal edema or rhinorrhea.  Mouth/Throat: Mucous membranes are normal. No dental abscesses or uvula swelling.  Dry mucous membranes  Eyes: EOM are normal. Pupils are equal, round, and reactive to light.  Pale conjunctiva   Neck: Normal range of motion and full passive range of motion without pain. Neck supple.  Cardiovascular: Normal rate, regular rhythm and normal heart sounds.  Exam reveals no gallop and no friction rub.   No murmur heard. Pulmonary/Chest: Effort normal and breath sounds normal. No respiratory distress. She has no wheezes. She has no rhonchi. She has no rales. She exhibits no tenderness and no  crepitus.  Abdominal: Soft. Normal appearance and bowel sounds are normal. She exhibits no distension. There is tenderness. There is no rebound and no guarding.  RUQ TTP  Musculoskeletal: Normal range of motion. She exhibits no edema or tenderness.  Moves all extremities well.   Neurological: She is alert and oriented to person, place, and time. She has normal strength. No cranial nerve deficit.  Skin: Skin is warm, dry and intact. No rash noted. No erythema. No pallor.  Skin appears pale  Psychiatric: She has a normal mood and affect. Her speech is normal and behavior is normal. Her mood appears not anxious.  Nursing note and vitals reviewed.   ED Course  Procedures   Medications  0.9 %  sodium  chloride infusion ( Intravenous New Bag/Given 03/23/15 0053)  fentaNYL (SUBLIMAZE) injection 50 mcg (50 mcg Intravenous Given 03/23/15 0053)  ondansetron (ZOFRAN) injection 4 mg (4 mg Intravenous Given 03/23/15 0052)  famotidine (PEPCID) IVPB 20 mg premix (0 mg Intravenous Stopped 03/23/15 0310)  fentaNYL (SUBLIMAZE) injection 25 mcg (25 mcg Intravenous Given 03/23/15 0245)    DIAGNOSTIC STUDIES: Oxygen Saturation is 98% on RA, normal by my interpretation.    COORDINATION OF CARE: 12:43 AM - Discussed probable gallbladder origin of the pain. Discussed plans to order diagnostic studies and imaging. Pt advised of plan for treatment and pt agrees.   Recheck 02:15 pt is feeling better, still has mild pain in her right flank. More pain meds ordered. Discussed she needs outpatient [place] and will schedule.   Recheck at 3:30 AM patient states her pain is much better. She feels ready to be discharged. She is scheduled to get outpatient ultrasound of her right upper quadrant at 10:15 this morning. We discussed her URI symptoms have only been present 2-3 days and at this point is most likely viral and no specific treatment would be needed other than the symptomatic treatment she's already been  doing.  Labs Review Results for orders placed or performed during the hospital encounter of 03/23/15  Comprehensive metabolic panel  Result Value Ref Range   Sodium 141 135 - 145 mmol/L   Potassium 3.6 3.5 - 5.1 mmol/L   Chloride 104 101 - 111 mmol/L   CO2 26 22 - 32 mmol/L   Glucose, Bld 113 (H) 65 - 99 mg/dL   BUN 17 6 - 20 mg/dL   Creatinine, Ser 0.86 0.44 - 1.00 mg/dL   Calcium 9.6 8.9 - 10.3 mg/dL   Total Protein 7.4 6.5 - 8.1 g/dL   Albumin 3.8 3.5 - 5.0 g/dL   AST 18 15 - 41 U/L   ALT 15 14 - 54 U/L   Alkaline Phosphatase 87 38 - 126 U/L   Total Bilirubin 0.5 0.3 - 1.2 mg/dL   GFR calc non Af Amer >60 >60 mL/min   GFR calc Af Amer >60 >60 mL/min   Anion gap 11 5 - 15  Lipase, blood  Result Value Ref Range   Lipase 59 (H) 11 - 51 U/L  CBC with Differential  Result Value Ref Range   WBC 6.3 4.0 - 10.5 K/uL   RBC 4.14 3.87 - 5.11 MIL/uL   Hemoglobin 12.6 12.0 - 15.0 g/dL   HCT 37.5 36.0 - 46.0 %   MCV 90.6 78.0 - 100.0 fL   MCH 30.4 26.0 - 34.0 pg   MCHC 33.6 30.0 - 36.0 g/dL   RDW 12.7 11.5 - 15.5 %   Platelets 261 150 - 400 K/uL   Neutrophils Relative % 51 %   Neutro Abs 3.2 1.7 - 7.7 K/uL   Lymphocytes Relative 35 %   Lymphs Abs 2.2 0.7 - 4.0 K/uL   Monocytes Relative 12 %   Monocytes Absolute 0.7 0.1 - 1.0 K/uL   Eosinophils Relative 2 %   Eosinophils Absolute 0.1 0.0 - 0.7 K/uL   Basophils Relative 0 %   Basophils Absolute 0.0 0.0 - 0.1 K/uL  Urinalysis, Routine w reflex microscopic (not at [facility])  Result Value Ref Range   Color, Urine YELLOW YELLOW   APPearance CLEAR CLEAR   Specific Gravity, Urine 1.025 1.005 - 1.030   pH 5.5 5.0 - 8.0   Glucose, UA NEGATIVE NEGATIVE mg/dL   Hgb urine dipstick  NEGATIVE NEGATIVE   Bilirubin Urine NEGATIVE NEGATIVE   Ketones, ur 15 (A) NEGATIVE mg/dL   Protein, ur NEGATIVE NEGATIVE mg/dL   Nitrite NEGATIVE NEGATIVE   Leukocytes, UA TRACE (A) NEGATIVE  Urine microscopic-add on  Result Value Ref Range   Squamous  Epithelial / LPF 0-5 (A) NONE SEEN   WBC, UA 6-30 0 - 5 WBC/hpf   RBC / HPF NONE SEEN 0 - 5 RBC/hpf   Bacteria, UA FEW (A) NONE SEEN   Casts HYALINE CASTS (A) NEGATIVE   Laboratory interpretation all normal except mild elevation of lipase     Imaging Review Ct Abdomen Pelvis Wo Contrast  03/23/2015  CLINICAL DATA:  Right upper quadrant flank pain since yesterday. Iodine allergy EXAM: CT ABDOMEN AND PELVIS WITHOUT CONTRAST TECHNIQUE: Multidetector CT imaging of the abdomen and pelvis was performed following the standard protocol without IV contrast. COMPARISON:  09/15/2011 FINDINGS: Lower chest and abdominal wall:  No contributory findings. Hepatobiliary: No focal liver abnormality.No evidence of biliary stone. Pancreas: Unremarkable. Spleen: Unremarkable. Adrenals/Urinary Tract: Negative adrenals. No hydronephrosis or stone. Unremarkable bladder. Reproductive:Hysterectomy with negative adnexa Stomach/Bowel:  No obstruction. No appendicitis. Vascular/Lymphatic: No acute vascular abnormality. No mass or adenopathy. Peritoneal: No ascites or pneumoperitoneum. Musculoskeletal: Lumbar degenerative disc narrowing and bulging with L4-5 laminectomy. IMPRESSION: No acute finding or explanation for symptoms. Electronically Signed   By: Monte Fantasia M.D.   On: 03/23/2015 02:01   Dg Chest 2 View  03/23/2015  CLINICAL DATA:  Cough for several days.  Low-grade fever EXAM: CHEST  2 VIEW COMPARISON:  09/16/2013 FINDINGS: Normal heart size and mediastinal contours. No acute infiltrate or edema. No effusion or pneumothorax. No acute osseous findings. IMPRESSION: No active cardiopulmonary disease. Electronically Signed   By: Monte Fantasia M.D.   On: 03/23/2015 01:54   I have personally reviewed and evaluated these images and lab results as part of my medical decision-making.   EKG Interpretation None      MDM   patient presents with second episode of right upper quadrant pain that starts in the  epigastric area and then radiates into her back that is happened in the past 48 hours. She has nausea without vomiting. Her testing does not reveal any acute abnormality however her symptoms are very suggestive of gallbladder disease. She is to return in the morning for outpatient ultrasound of her gallbladder. She has hydrocodone at home leftover from back surgery she can take if needed. She was given Zofran for nausea. If she has gallstones she would be referred to surgery, if she does not have gallstones she would be referred to GI. Her omeprazole will be increased to twice a day for 2 weeks then back down to once a day. We also discussed dietary things to avoid until this issue is more defined. She also coincidentally has been having URI symptoms for about 2 days. She has only had low-grade fever. Her chest x-ray does not show infiltrate. She was advised to continue her symptomatic treatment of her symptoms at this point.    Final diagnoses:  RUQ abdominal pain  Nausea  URI, acute    New Prescriptions   OMEPRAZOLE (PRILOSEC) 20 MG CAPSULE    Take 1 po BID x 2 weeks then once a day   ONDANSETRON (ZOFRAN) 4 MG TABLET    Take 1 tablet (4 mg total) by mouth every 8 (eight) hours as needed.    Plan discharge  Rolland Porter, MD, FACEP   I personally performed  the services described in this documentation, which was scribed in my presence. The recorded information has been reviewed and considered.  Rolland Porter, MD, Barbette Or, MD 03/23/15 [phone]

## 2015-03-23 NOTE — Progress Notes (Signed)
CRITICAL VALUE ALERT  Critical value received:  MRSA Positive  Date of notification:  03/23/2015  Time of notification:  20:27  Critical value read back: yes  Nurse who received alert:  Stephannie Peters  MD notified (1st page):  Hospitalist  Time of first page:  20:45  MD notified (2nd page):  Time of second page:  Responding MD:  Hospitalist  Time MD responded:  20:45

## 2015-03-23 NOTE — ED Provider Notes (Signed)
CSN: [id number]     Arrival date & time 03/23/15  1103 History  By signing my name below, I, Tula Nakayama, attest that this documentation has been prepared under the direction and in the presence of Julianne Rice, MD.  Electronically Signed: Tula Nakayama, ED Scribe. 03/23/2015. 11:45 AM.  Chief Complaint  Patient presents with  . Cholelithiasis   The history is provided by the patient. No language interpreter was used.    HPI Comments: Valerie Hampton is a 74 y.o. female who presents to the Emergency Department complaining of gradually improved RUQ pain that started yesterday evening. Pt was seen in the ED last night complaining of RUQ pain with radiation to her back and assocaited nausea and vomiting. She had an outpatient [place] today which showed possible cholecystitis. Pt reports a similar episode on Saturday which resolved on its own after a few hours. Pt took one sip of water with Prilosec 4 hours ago. She last ate last night. Denies fever or chills.  Past Medical History  Diagnosis Date  . Fibromyalgia   . Chronic back pain   . Allergic rhinitis   . Osteopenia   . Intraductal papilloma 2007    no evidence of malignancy  . Occipital neuralgia   . Cataract   . PONV (postoperative nausea and vomiting)   . Family history of anesthesia complication     " sister went out ? "   . Anxiety   . Depression   . GERD (gastroesophageal reflux disease)   . Headache(784.0)     due ot facial nerve condition   . Arthritis    Past Surgical History  Procedure Laterality Date  . Abdominal hysterectomy    . Neck surgery    . Cervical spine surgery    . Breast surgery Bilateral   . Tubal ligation    . Bladder tack    . Temporal artery biopsy / ligation Right   . Lumbar laminectomy/decompression microdiscectomy N/A 09/09/2013    Procedure: LUMBAR LAMINECTOMY/DECOMPRESSION L4-5 MICRODISCECTOMY LEFT L4-5;  Surgeon: Tobi Bastos, MD;  Location: [facility];  Service: Orthopedics;   Laterality: N/A;   Family History  Problem Relation Age of Onset  . Cancer - Colon Mother   . Cancer Mother     Breast    Social History  Substance Use Topics  . Smoking status: Never Smoker   . Smokeless tobacco: Never Used  . Alcohol Use: No   OB History    Gravida Para Term Preterm AB TAB SAB Ectopic Multiple Living   7 4 4  3  3   3      Review of Systems  Constitutional: Negative for fever and chills.  Respiratory: Negative for cough and shortness of breath.   Cardiovascular: Negative for chest pain.  Gastrointestinal: Positive for nausea, vomiting and abdominal pain. Negative for diarrhea.  Genitourinary: Negative for dysuria and flank pain.  Musculoskeletal: Positive for back pain. Negative for neck pain and neck stiffness.  Skin: Negative for rash and wound.  Neurological: Negative for dizziness, weakness, light-headedness, numbness and headaches.  All other systems reviewed and are negative.  Allergies  Baclofen; Contrast media; Latex; Morphine and related; Neosporin; Nsaids; Adhesive; Ciprofloxacin; and Iohexol  Home Medications   Prior to Admission medications   Medication Sig Start Date End Date Taking? Authorizing Provider  ALPRAZolam (XANAX) 0.25 MG tablet Take 0.25 mg by mouth 2 (two) times daily as needed for anxiety.    Yes Historical Provider, MD  HYDROcodone-acetaminophen (  NORCO/VICODIN) 5-325 MG tablet Take 1 tablet by mouth every 6 (six) hours as needed for moderate pain.   Yes Historical Provider, MD  omeprazole (PRILOSEC) 20 MG capsule Take 1 po BID x 2 weeks then once a day 03/23/15  Yes Rolland Porter, MD  ondansetron (ZOFRAN) 4 MG tablet Take 1 tablet (4 mg total) by mouth every 8 (eight) hours as needed. 03/23/15  Yes Rolland Porter, MD  phenylephrine (SUDAFED PE) 10 MG TABS tablet Take 10 mg by mouth every 4 (four) hours as needed.   Yes Historical Provider, MD  polyethylene glycol (MIRALAX / GLYCOLAX) packet Take 17 g by mouth 2 (two) times daily.    Yes  Historical Provider, MD  docusate sodium (COLACE) 100 MG capsule Take 100 mg by mouth 2 (two) times daily.     Historical Provider, MD  magnesium hydroxide (MILK OF MAGNESIA) 400 MG/5ML suspension Take 30 mLs by mouth daily as needed for mild constipation.     Historical Provider, MD  methocarbamol (ROBAXIN) 500 MG tablet Take 1 tablet (500 mg total) by mouth every 6 (six) hours as needed for muscle spasms. 09/11/13   Amber Cecilio Asper, PA-C  omeprazole (PRILOSEC) 40 MG capsule Take 1 capsule by mouth daily. 03/15/15   Historical Provider, MD  oxyCODONE (OXY IR/ROXICODONE) 5 MG immediate release tablet Take 1-3 tablets (5-15 mg total) by mouth every 4 (four) hours as needed for severe pain. 09/11/13   Amber Cecilio Asper, PA-C  triamcinolone cream (KENALOG) 0.5 % Apply 1 application topically 3 (three) times daily. 03/22/15   Historical Provider, MD   BP 137/66 mmHg  Pulse 73  Temp(Src) 97.7 F (36.5 C) (Oral)  Resp 16  Ht 4\' 9"  (1.448 m)  Wt 100 lb (45.36 kg)  BMI 21.63 kg/m2  SpO2 100% Physical Exam  Constitutional: She is oriented to person, place, and time. She appears well-developed and well-nourished. No distress.  HENT:  Head: Normocephalic and atraumatic.  Mouth/Throat: Oropharynx is clear and moist.  Eyes: EOM are normal. Pupils are equal, round, and reactive to light.  Neck: Normal range of motion. Neck supple.  Cardiovascular: Normal rate and regular rhythm.   Pulmonary/Chest: Effort normal and breath sounds normal. No respiratory distress. She has no wheezes. She has no rales. She exhibits no tenderness.  Abdominal: Soft. Bowel sounds are normal. She exhibits no distension and no mass. There is tenderness. There is no rebound and no guarding.  Right upper quadrant tenderness with palpation. No rebound or guarding.  Musculoskeletal: Normal range of motion. She exhibits no edema or tenderness.  No CVA tenderness bilaterally.  Neurological: She is alert and oriented to person, place,  and time.  Moves all extremities without deficit. Sensation is grossly intact. Ambulating without difficulty.  Skin: Skin is warm and dry. No rash noted. No erythema.  Psychiatric: She has a normal mood and affect. Her behavior is normal.  Nursing note and vitals reviewed.   ED Course  Procedures  DIAGNOSTIC STUDIES: Oxygen Saturation is 100% on RA, normal by my interpretation.    COORDINATION OF CARE: 11:41 AM Discussed treatment plan with pt which includes lab work and consultation with Psychologist, sport and exercise. She agreed to plan.  Labs Review Labs Reviewed  CBC WITH DIFFERENTIAL/PLATELET  COMPREHENSIVE METABOLIC PANEL  LIPASE, BLOOD    Imaging Review Ct Abdomen Pelvis Wo Contrast  03/23/2015  CLINICAL DATA:  Right upper quadrant flank pain since yesterday. Iodine allergy EXAM: CT ABDOMEN AND PELVIS WITHOUT CONTRAST TECHNIQUE: Multidetector CT imaging of the  abdomen and pelvis was performed following the standard protocol without IV contrast. COMPARISON:  09/15/2011 FINDINGS: Lower chest and abdominal wall:  No contributory findings. Hepatobiliary: No focal liver abnormality.No evidence of biliary stone. Pancreas: Unremarkable. Spleen: Unremarkable. Adrenals/Urinary Tract: Negative adrenals. No hydronephrosis or stone. Unremarkable bladder. Reproductive:Hysterectomy with negative adnexa Stomach/Bowel:  No obstruction. No appendicitis. Vascular/Lymphatic: No acute vascular abnormality. No mass or adenopathy. Peritoneal: No ascites or pneumoperitoneum. Musculoskeletal: Lumbar degenerative disc narrowing and bulging with L4-5 laminectomy. IMPRESSION: No acute finding or explanation for symptoms. Electronically Signed   By: Monte Fantasia M.D.   On: 03/23/2015 02:01   Dg Chest 2 View  03/23/2015  CLINICAL DATA:  Cough for several days.  Low-grade fever EXAM: CHEST  2 VIEW COMPARISON:  09/16/2013 FINDINGS: Normal heart size and mediastinal contours. No acute infiltrate or edema. No effusion or  pneumothorax. No acute osseous findings. IMPRESSION: No active cardiopulmonary disease. Electronically Signed   By: Monte Fantasia M.D.   On: 03/23/2015 01:54   US Abdomen Limited Ruq/gall Gladder  03/23/2015  CLINICAL DATA:  Patient with right upper quadrant pain radiating to the right flank for 3 days. EXAM: US ABDOMEN LIMITED - RIGHT UPPER QUADRANT COMPARISON:  CT abdomen pelvis earlier same date. FINDINGS: Gallbladder: Multiple mobile gallstones are demonstrated within the gallbladder lumen. The gallbladder wall is thickened with associated edema. Negative sonographic Murphy's sign. Common bile duct: Diameter: 7 mm Liver: No focal lesion identified. Within normal limits in parenchymal echogenicity. IMPRESSION: Multiple mobile gallstones demonstrated within the gallbladder lumen with associated gallbladder wall thickening. Negative sonographic Murphy's sign. Findings are nonspecific however may represent acute cholecystitis in the appropriate clinical setting. Consider correlation with HIDA scan. Prominent common bile duct.  Consider correlation with LFTs. These results will be called to the ordering clinician or representative by the Radiologist Assistant, and communication documented in the PACS or zVision Dashboard. Electronically Signed   By: Lovey Newcomer M.D.   On: 03/23/2015 10:42   I have personally reviewed and evaluated these images and lab results as part of my medical decision-making.   EKG Interpretation None      MDM   Final diagnoses:  Biliary colic    I personally performed the services described in this documentation, which was scribed in my presence. The recorded information has been reviewed and is accurate.   Discussed with Dr. Arnoldo Morale who asked to have the hospitalist admit the patient. Will plan to operate tomorrow morning. Stress with Dr. Jerilee Hoh. Will admit to MedSurg bed.   Julianne Rice, MD 03/23/15 1225

## 2015-03-23 NOTE — ED Notes (Addendum)
Pt reports RUQ soreness, tenderness since Saturday. Seen in ED last night. Ultrasound this morning reveals acute cholelithiasis. NPO since 2030 on 12/19.

## 2015-03-23 NOTE — ED Notes (Signed)
PT stated NPO since 2030 last night and only a few sips of water with po meds today.

## 2015-03-23 NOTE — ED Notes (Signed)
Report called to Anderson Malta, RN for room 330.

## 2015-03-24 ENCOUNTER — Encounter (HOSPITAL_COMMUNITY): Payer: Self-pay | Admitting: Anesthesiology

## 2015-03-24 ENCOUNTER — Inpatient Hospital Stay (HOSPITAL_COMMUNITY): Payer: Commercial Managed Care - HMO | Admitting: Anesthesiology

## 2015-03-24 ENCOUNTER — Encounter (HOSPITAL_COMMUNITY)
Admission: EM | Disposition: A | Discharge status: Home / Self Care | Payer: Self-pay | Source: Home / Self Care | Attending: General Surgery

## 2015-03-24 HISTORY — PX: CHOLECYSTECTOMY: SHX55

## 2015-03-24 LAB — COMPREHENSIVE METABOLIC PANEL
ALBUMIN: 3.1 g/dL — AB (ref 3.5–5.0)
ALK PHOS: 66 U/L (ref 38–126)
ALT: 13 U/L — ABNORMAL LOW (ref 14–54)
ANION GAP: 6 (ref 5–15)
AST: 13 U/L — ABNORMAL LOW (ref 15–41)
BUN: 12 mg/dL (ref 6–20)
CHLORIDE: 107 mmol/L (ref 101–111)
CO2: 28 mmol/L (ref 22–32)
Calcium: 9.1 mg/dL (ref 8.9–10.3)
Creatinine, Ser: 0.98 mg/dL (ref 0.44–1.00)
GFR calc non Af Amer: 56 mL/min — ABNORMAL LOW (ref >60)
GLUCOSE: 98 mg/dL (ref 65–99)
POTASSIUM: 4.6 mmol/L (ref 3.5–5.1)
SODIUM: 141 mmol/L (ref 135–145)
Total Bilirubin: 0.6 mg/dL (ref 0.3–1.2)
Total Protein: 6.2 g/dL — ABNORMAL LOW (ref 6.5–8.1)

## 2015-03-24 LAB — CBC
HEMATOCRIT: 34.4 % — AB (ref 36.0–46.0)
HEMOGLOBIN: 11.6 g/dL — AB (ref 12.0–15.0)
MCH: 30.7 pg (ref 26.0–34.0)
MCHC: 33.7 g/dL (ref 30.0–36.0)
MCV: 91 fL (ref 78.0–100.0)
Platelets: 238 10*3/uL (ref 150–400)
RBC: 3.78 MIL/uL — AB (ref 3.87–5.11)
RDW: 13 % (ref 11.5–15.5)
WBC: 6 10*3/uL (ref 4.0–10.5)

## 2015-03-24 SURGERY — LAPAROSCOPIC CHOLECYSTECTOMY
Anesthesia: General | Site: Abdomen

## 2015-03-24 MED ORDER — ONDANSETRON HCL 4 MG/2ML IJ SOLN
4.0000 mg | Freq: Once | INTRAMUSCULAR | Status: AC | PRN
  Administered 2015-03-24: 4 mg via INTRAVENOUS

## 2015-03-24 MED ORDER — ONDANSETRON HCL 4 MG/2ML IJ SOLN
INTRAMUSCULAR | Status: AC
Start: 2015-03-24 — End: 2015-03-24
  Filled 2015-03-24: qty 2

## 2015-03-24 MED ORDER — FENTANYL CITRATE (PF) 100 MCG/2ML IJ SOLN
INTRAMUSCULAR | Status: DC | PRN
  Administered 2015-03-24: 25 ug via INTRAVENOUS
  Administered 2015-03-24: 50 ug via INTRAVENOUS
  Administered 2015-03-24: 25 ug via INTRAVENOUS

## 2015-03-24 MED ORDER — NEOSTIGMINE METHYLSULFATE 10 MG/10ML IV SOLN
INTRAVENOUS | Status: AC
  Filled 2015-03-24: qty 1

## 2015-03-24 MED ORDER — POVIDONE-IODINE 10 % EX OINT
TOPICAL_OINTMENT | CUTANEOUS | Status: AC
  Filled 2015-03-24: qty 1

## 2015-03-24 MED ORDER — HEMOSTATIC AGENTS (NO CHARGE) OPTIME
TOPICAL | Status: DC | PRN
  Administered 2015-03-24: 1 via TOPICAL

## 2015-03-24 MED ORDER — POVIDONE-IODINE 10 % OINT PACKET: TOPICAL_OINTMENT | CUTANEOUS | Status: DC | PRN

## 2015-03-24 MED ORDER — ROCURONIUM BROMIDE 100 MG/10ML IV SOLN
INTRAVENOUS | Status: DC | PRN
  Administered 2015-03-24: 15 mg via INTRAVENOUS
  Administered 2015-03-24: 5 mg via INTRAVENOUS

## 2015-03-24 MED ORDER — MIDAZOLAM HCL 5 MG/5ML IJ SOLN
INTRAMUSCULAR | Status: DC | PRN
  Administered 2015-03-24: 2 mg via INTRAVENOUS

## 2015-03-24 MED ORDER — MIDAZOLAM HCL 2 MG/2ML IJ SOLN
INTRAMUSCULAR | Status: AC
  Filled 2015-03-24: qty 2

## 2015-03-24 MED ORDER — GLYCOPYRROLATE 0.2 MG/ML IJ SOLN
INTRAMUSCULAR | Status: DC | PRN
  Administered 2015-03-24: 0.4 mg via INTRAVENOUS

## 2015-03-24 MED ORDER — HYDROMORPHONE HCL 1 MG/ML IJ SOLN
1.0000 mg | INTRAMUSCULAR | Status: DC | PRN
  Administered 2015-03-24: 1 mg via INTRAVENOUS
  Filled 2015-03-24: qty 1

## 2015-03-24 MED ORDER — FENTANYL CITRATE (PF) 100 MCG/2ML IJ SOLN
25.0000 ug | INTRAMUSCULAR | Status: DC | PRN
  Administered 2015-03-24 (×4): 50 ug via INTRAVENOUS
  Filled 2015-03-24 (×2): qty 2

## 2015-03-24 MED ORDER — HYDROMORPHONE HCL 1 MG/ML IJ SOLN
0.5000 mg | Freq: Once | INTRAMUSCULAR | Status: AC
  Administered 2015-03-24: 0.5 mg via INTRAVENOUS
  Filled 2015-03-24: qty 1

## 2015-03-24 MED ORDER — ONDANSETRON HCL 4 MG/2ML IJ SOLN
INTRAMUSCULAR | Status: AC
  Filled 2015-03-24: qty 2

## 2015-03-24 MED ORDER — HYDROMORPHONE HCL 1 MG/ML IJ SOLN
0.5000 mg | INTRAMUSCULAR | Status: DC | PRN
  Administered 2015-03-24: 0.5 mg via INTRAVENOUS
  Filled 2015-03-24: qty 1

## 2015-03-24 MED ORDER — GLYCOPYRROLATE 0.2 MG/ML IJ SOLN
INTRAMUSCULAR | Status: AC
  Filled 2015-03-24: qty 2

## 2015-03-24 MED ORDER — DIPHENHYDRAMINE HCL 50 MG/ML IJ SOLN: 25.0000 mg | Freq: Once | INTRAMUSCULAR | Status: DC

## 2015-03-24 MED ORDER — DEXAMETHASONE SODIUM PHOSPHATE 4 MG/ML IJ SOLN
4.0000 mg | Freq: Once | INTRAMUSCULAR | Status: AC
  Administered 2015-03-24: 4 mg via INTRAVENOUS

## 2015-03-24 MED ORDER — PROPOFOL 10 MG/ML IV BOLUS
INTRAVENOUS | Status: DC | PRN
  Administered 2015-03-24: 100 mg via INTRAVENOUS

## 2015-03-24 MED ORDER — SODIUM CHLORIDE 0.9 % IR SOLN
Status: DC | PRN
  Administered 2015-03-24: 1000 mL

## 2015-03-24 MED ORDER — ONDANSETRON HCL 4 MG/2ML IJ SOLN
4.0000 mg | Freq: Once | INTRAMUSCULAR | Status: AC
  Administered 2015-03-24: 4 mg via INTRAVENOUS

## 2015-03-24 MED ORDER — BUPIVACAINE HCL (PF) 0.5 % IJ SOLN
INTRAMUSCULAR | Status: AC
  Filled 2015-03-24: qty 30

## 2015-03-24 MED ORDER — LIDOCAINE HCL 1 % IJ SOLN
INTRAMUSCULAR | Status: DC | PRN
  Administered 2015-03-24: 20 mg via INTRADERMAL

## 2015-03-24 MED ORDER — ENOXAPARIN SODIUM 40 MG/0.4ML ~~LOC~~ SOLN: 40.0000 mg | SUBCUTANEOUS | Status: DC

## 2015-03-24 MED ORDER — PROPOFOL 10 MG/ML IV BOLUS
INTRAVENOUS | Status: AC
  Filled 2015-03-24: qty 20

## 2015-03-24 MED ORDER — BUPIVACAINE HCL (PF) 0.5 % IJ SOLN
INTRAMUSCULAR | Status: DC | PRN
  Administered 2015-03-24: 10 mL

## 2015-03-24 MED ORDER — LACTATED RINGERS IV SOLN
INTRAVENOUS | Status: DC
  Administered 2015-03-24: 11:00:00 via INTRAVENOUS

## 2015-03-24 MED ORDER — NEOSTIGMINE METHYLSULFATE 10 MG/10ML IV SOLN
INTRAVENOUS | Status: DC | PRN
  Administered 2015-03-24: 2 mg via INTRAVENOUS
  Administered 2015-03-24: 1 mg via INTRAVENOUS

## 2015-03-24 MED ORDER — DEXAMETHASONE SODIUM PHOSPHATE 4 MG/ML IJ SOLN
INTRAMUSCULAR | Status: AC
  Filled 2015-03-24: qty 1

## 2015-03-24 MED ORDER — LORAZEPAM 2 MG/ML IJ SOLN
1.0000 mg | INTRAMUSCULAR | Status: DC | PRN
  Administered 2015-03-24: 1 mg via INTRAVENOUS
  Filled 2015-03-24: qty 1

## 2015-03-24 MED ORDER — FENTANYL CITRATE (PF) 250 MCG/5ML IJ SOLN
INTRAMUSCULAR | Status: AC
  Filled 2015-03-24: qty 5

## 2015-03-24 MED ORDER — SIMETHICONE 80 MG PO CHEW: 40.0000 mg | CHEWABLE_TABLET | Freq: Four times a day (QID) | ORAL | Status: DC | PRN

## 2015-03-24 MED ORDER — MIDAZOLAM HCL 2 MG/2ML IJ SOLN
1.0000 mg | INTRAMUSCULAR | Status: DC | PRN
  Administered 2015-03-24: 2 mg via INTRAVENOUS

## 2015-03-24 SURGICAL SUPPLY — 45 items
APPLIER CLIP LAPSCP 10X32 DD (CLIP) ×3 IMPLANT
BAG HAMPER (MISCELLANEOUS) ×3 IMPLANT
BAG SPEC RTRVL LRG 6X4 10 (ENDOMECHANICALS) ×1
CHLORAPREP W/TINT 26ML (MISCELLANEOUS) ×3 IMPLANT
CLOTH BEACON ORANGE TIMEOUT ST (SAFETY) ×3 IMPLANT
COVER LIGHT HANDLE STERIS (MISCELLANEOUS) ×6 IMPLANT
DECANTER SPIKE VIAL GLASS SM (MISCELLANEOUS) ×3 IMPLANT
ELECT REM PT RETURN 9FT ADLT (ELECTROSURGICAL) ×3
ELECTRODE REM PT RTRN 9FT ADLT (ELECTROSURGICAL) ×1 IMPLANT
FILTER SMOKE EVAC LAPAROSHD (FILTER) ×3 IMPLANT
FORMALIN 10 PREFIL 120ML (MISCELLANEOUS) ×3 IMPLANT
GLOVE BIOGEL PI IND STRL 7.0 (GLOVE) ×2 IMPLANT
GLOVE BIOGEL PI INDICATOR 7.0 (GLOVE) ×4
GLOVE EXAM NITRILE MD LF STRL (GLOVE) ×3 IMPLANT
GLOVE SKINSENSE NS SZ7.0 (GLOVE) ×4
GLOVE SKINSENSE STRL SZ7.0 (GLOVE) ×2 IMPLANT
GLOVE SURG SS PI 7.5 STRL IVOR (GLOVE) ×3 IMPLANT
GOWN STRL REUS W/ TWL XL LVL3 (GOWN DISPOSABLE) ×1 IMPLANT
GOWN STRL REUS W/TWL LRG LVL3 (GOWN DISPOSABLE) ×6 IMPLANT
GOWN STRL REUS W/TWL XL LVL3 (GOWN DISPOSABLE) ×3
HEMOSTAT SNOW SURGICEL 2X4 (HEMOSTASIS) ×3 IMPLANT
INST SET LAPROSCOPIC AP (KITS) ×3 IMPLANT
IV NS IRRIG 3000ML ARTHROMATIC (IV SOLUTION) IMPLANT
KIT ROOM TURNOVER APOR (KITS) ×3 IMPLANT
MANIFOLD NEPTUNE II (INSTRUMENTS) ×3 IMPLANT
NEEDLE INSUFFLATION 14GA 120MM (NEEDLE) ×3 IMPLANT
NS IRRIG 1000ML POUR BTL (IV SOLUTION) ×3 IMPLANT
PACK LAP CHOLE LZT030E (CUSTOM PROCEDURE TRAY) ×3 IMPLANT
PAD ARMBOARD 7.5X6 YLW CONV (MISCELLANEOUS) ×3 IMPLANT
POUCH SPECIMEN RETRIEVAL 10MM (ENDOMECHANICALS) ×3 IMPLANT
SET BASIN LINEN APH (SET/KITS/TRAYS/PACK) ×3 IMPLANT
SET TUBE IRRIG SUCTION NO TIP (IRRIGATION / IRRIGATOR) IMPLANT
SLEEVE ENDOPATH XCEL 5M (ENDOMECHANICALS) ×3 IMPLANT
SPONGE GAUZE 2X2 8PLY STER LF (GAUZE/BANDAGES/DRESSINGS) ×4
SPONGE GAUZE 2X2 8PLY STRL LF (GAUZE/BANDAGES/DRESSINGS) ×8 IMPLANT
STAPLER VISISTAT (STAPLE) ×3 IMPLANT
SUT VICRYL 0 UR6 27IN ABS (SUTURE) ×3 IMPLANT
TAPE CLOTH SURG 4X10 WHT LF (GAUZE/BANDAGES/DRESSINGS) ×3 IMPLANT
TAPE PAPER 3X10 WHT MICROPORE (GAUZE/BANDAGES/DRESSINGS) ×3 IMPLANT
TROCAR ENDO BLADELESS 11MM (ENDOMECHANICALS) ×3 IMPLANT
TROCAR XCEL NON-BLD 5MMX100MML (ENDOMECHANICALS) ×3 IMPLANT
TROCAR XCEL UNIV SLVE 11M 100M (ENDOMECHANICALS) ×3 IMPLANT
TUBING INSUFFLATION (TUBING) ×3 IMPLANT
WARMER LAPAROSCOPE (MISCELLANEOUS) ×3 IMPLANT
YANKAUER SUCT 12FT TUBE ARGYLE (SUCTIONS) ×3 IMPLANT

## 2015-03-24 NOTE — Op Note (Signed)
Patient:  Valerie Hampton  DOB:  [birth year]  MRN:  [id number]   Preop Diagnosis:  Cholecystitis, cholelithiasis  Postop Diagnosis:  Same  Procedure:  Laparoscopic cholecystectomy  Surgeon:  Aviva Signs, M.D.  Anes:  Gen. endotracheal  Indications:  Patient is a 74 year old white female who presents with her second episode of right upper quadrant abdominal pain. She was found to have acute cholecystitis with cholelithiasis. The risks and benefits of the procedure including bleeding, infection, hepatobiliary injury, and the possibility of an open procedure were fully explained to the patient, who gave informed consent.  Procedure note:  The patient was placed in the supine position. After induction of general endotracheal anesthesia, the abdomen was prepped and draped using the usual sterile technique with DuraPrep. Surgical site confirmation was performed.  A supraumbilical incision was made down to the fascia. A Veress needle was introduced into the abdominal cavity and confirmation of placement was done using the saline drop test. The abdomen was then insufflated to 16 mmHg pressure. An 11 mm trocar was introduced into the abdominal cavity under direct visualization without difficulty. The patient was placed in reverse Trendelenburg position and additional 11 mm trocar was placed the gastric region a 5 mm trochars were placed the right upper quadrant and right flank regions. Liver was inspected and noted to be within normal limits. The gallbladder was noted to be edematous with a thickened gallbladder wall. The gallbladder was retracted in a dynamic fashion in order to provide a critical view of the triangle of Calot. The cystic duct was first identified. Its juncture to the infundibulum was fully identified. Endoclips were placed proximally and distally on the cystic duct, and the cystic duct was divided. This was likewise done cystic artery. The gallbladder was freed away from the  gallbladder fossa using Bovie electrocautery. The gallbladder was delivered through the epigastric trocar site using an Endo Catch bag without difficulty. The gallbladder fossa was inspected and no abnormal bleeding or bile leakage was noted. Surgicel was placed the gallbladder fossa. All fluid and air were then evacuated from the abdominal cavity prior to removal of the trochars.  All wounds were irrigated with normal saline. All wounds were injected with 0.5% Sensorcaine. The supraumbilical fascia as well as epigastric fascia were reapproximated using 0 Vicryl interrupted sutures. All skin incisions were closed using staples. Dry sterile dressings were applied.  All tape and needle counts were correct at the end of the procedure. Patient was extubated in the operating room and transferred to PACU in stable condition.  Complications:  None  EBL:  Minimal  Specimen:  Gallbladder

## 2015-03-24 NOTE — Anesthesia Procedure Notes (Signed)
Procedure Name: Intubation Date/Time: 03/24/2015 12:18 PM Performed by: Charmaine Downs Pre-anesthesia Checklist: Emergency Drugs available, Patient identified, Suction available and Patient being monitored Patient Re-evaluated:Patient Re-evaluated prior to inductionOxygen Delivery Method: Circle system utilized Preoxygenation: Pre-oxygenation with 100% oxygen Intubation Type: IV induction Ventilation: Mask ventilation without difficulty and Oral airway inserted - appropriate to patient size Laryngoscope Size: Mac and 3 Grade View: Grade II Tube type: Oral Tube size: 7.0 mm Number of attempts: 1 Airway Equipment and Method: Stylet and Oral airway Placement Confirmation: ETT inserted through vocal cords under direct vision,  breath sounds checked- equal and bilateral and positive ETCO2 Secured at: 22 cm Tube secured with: Tape Dental Injury: Teeth and Oropharynx as per pre-operative assessment

## 2015-03-24 NOTE — Anesthesia Postprocedure Evaluation (Signed)
Anesthesia Post Note  Patient: Valerie Hampton  Procedure(s) Performed: Procedure(s) (LRB): LAPAROSCOPIC CHOLECYSTECTOMY (N/A)  Patient location during evaluation: PACU Anesthesia Type: General Level of consciousness: awake and alert and patient cooperative Pain management: pain level controlled Vital Signs Assessment: post-procedure vital signs reviewed and stable Respiratory status: spontaneous breathing and patient connected to nasal cannula oxygen Cardiovascular status: blood pressure returned to baseline and stable Postop Assessment: no signs of nausea or vomiting Anesthetic complications: no    Last Vitals:  Filed Vitals:   03/24/15 1155 03/24/15 1305  BP: 139/71 175/71  Pulse:  75  Temp:    Resp: 24 23    Last Pain:  Filed Vitals:   03/24/15 1313  PainSc: 8                  Angelo Caroll J

## 2015-03-24 NOTE — Progress Notes (Signed)
Notified MD, pt is complaining of pain in the abdomen unrelieved by pain medication. Awaiting MD's response.

## 2015-03-24 NOTE — Transfer of Care (Signed)
Immediate Anesthesia Transfer of Care Note  Patient: Valerie Hampton  Procedure(s) Performed: Procedure(s): LAPAROSCOPIC CHOLECYSTECTOMY (N/A)  Patient Location: PACU  Anesthesia Type:General  Level of Consciousness: awake and patient cooperative  Airway & Oxygen Therapy: Patient Spontanous Breathing and Patient connected to face mask oxygen  Post-op Assessment: Report given to RN, Post -op Vital signs reviewed and stable and Patient moving all extremities  Post vital signs: Reviewed and stable  Last Vitals:  Filed Vitals:   03/24/15 1150 03/24/15 1155  BP: 149/63 139/71  Pulse:    Temp:    Resp: 20 24    Complications: No apparent anesthesia complications

## 2015-03-24 NOTE — Consult Note (Signed)
Reason for Consult: Cholecystitis, cholelithiasis Referring Physician: Hospitalist  Valerie Hampton is an 74 y.o. female.  HPI: Patient is a 74 year old white female who presents with a several day history of worsening right upper quadrant abdominal pain which radiates around the right flank to the back. She also complains of nausea. She states she had a similar episode several months ago, but this resolved on its own. She presented the emergency room and was found to have cholelithiasis with a normal common bile duct on ultrasound. Her liver enzyme tests are within normal limits.  Past Medical History  Diagnosis Date  . Fibromyalgia   . Chronic back pain   . Allergic rhinitis   . Osteopenia   . Intraductal papilloma 2007    no evidence of malignancy  . Occipital neuralgia   . Cataract   . PONV (postoperative nausea and vomiting)   . Family history of anesthesia complication     " sister went out ? "   . Anxiety   . Depression   . GERD (gastroesophageal reflux disease)   . Headache(784.0)     due ot facial nerve condition   . Arthritis     Past Surgical History  Procedure Laterality Date  . Abdominal hysterectomy    . Neck surgery    . Cervical spine surgery    . Breast surgery Bilateral   . Tubal ligation    . Bladder tack    . Temporal artery biopsy / ligation Right   . Lumbar laminectomy/decompression microdiscectomy N/A 09/09/2013    Procedure: LUMBAR LAMINECTOMY/DECOMPRESSION L4-5 MICRODISCECTOMY LEFT L4-5;  Surgeon: Tobi Bastos, MD;  Location: [facility];  Service: Orthopedics;  Laterality: N/A;    Family History  Problem Relation Age of Onset  . Cancer - Colon Mother   . Cancer Mother     Breast     Social History:  reports that she has quit smoking. Her smoking use included Cigarettes. She has never used smokeless tobacco. She reports that she does not drink alcohol or use illicit drugs.  Allergies:  Allergies  Allergen Reactions  . Baclofen Other (See  Comments)    Reaction unknown  . Contrast Media [Iodinated Diagnostic Agents]     rash  . Latex Other (See Comments)    Reaction unknown  . Morphine And Related Nausea And Vomiting  . Neosporin [Neomycin-Bacitracin Zn-Polymyx]     Rash, blisters   . Neosporin [Neomycin-Polymyxin-Gramicidin] Other (See Comments)    Blistering  . Nsaids Other (See Comments)    Agitates stomach due to acid reflux  . Robaxin [Methocarbamol]   . Adhesive [Tape] Rash  . Buspar [Buspirone] Anxiety  . Carbamazepine Anxiety  . Ciprofloxacin Rash  . Iohexol Rash  . Oxycodone Rash  . Relafen [Nabumetone] Anxiety  . Zoloft [Sertraline Hcl] Anxiety    Medications: I have reviewed the patient's current medications.  Results for orders placed or performed during the hospital encounter of 03/23/15 (from the past 48 hour(s))  CBC with Differential/Platelet     Status: None   Collection Time: 03/23/15 11:52 AM  Result Value Ref Range   WBC 5.5 4.0 - 10.5 K/uL   RBC 4.06 3.87 - 5.11 MIL/uL   Hemoglobin 12.4 12.0 - 15.0 g/dL   HCT 36.8 36.0 - 46.0 %   MCV 90.6 78.0 - 100.0 fL   MCH 30.5 26.0 - 34.0 pg   MCHC 33.7 30.0 - 36.0 g/dL   RDW 12.8 11.5 - 15.5 %  Platelets 255 150 - 400 K/uL   Neutrophils Relative % 47 %   Neutro Abs 2.6 1.7 - 7.7 K/uL   Lymphocytes Relative 42 %   Lymphs Abs 2.3 0.7 - 4.0 K/uL   Monocytes Relative 9 %   Monocytes Absolute 0.5 0.1 - 1.0 K/uL   Eosinophils Relative 1 %   Eosinophils Absolute 0.1 0.0 - 0.7 K/uL   Basophils Relative 1 %   Basophils Absolute 0.0 0.0 - 0.1 K/uL  Comprehensive metabolic panel     Status: None   Collection Time: 03/23/15 11:52 AM  Result Value Ref Range   Sodium 139 135 - 145 mmol/L   Potassium 3.9 3.5 - 5.1 mmol/L   Chloride 106 101 - 111 mmol/L   CO2 26 22 - 32 mmol/L   Glucose, Bld 86 65 - 99 mg/dL   BUN 12 6 - 20 mg/dL   Creatinine, Ser 0.89 0.44 - 1.00 mg/dL   Calcium 9.3 8.9 - 10.3 mg/dL   Total Protein 6.9 6.5 - 8.1 g/dL   Albumin  3.6 3.5 - 5.0 g/dL   AST 16 15 - 41 U/L   ALT 14 14 - 54 U/L   Alkaline Phosphatase 78 38 - 126 U/L   Total Bilirubin 0.4 0.3 - 1.2 mg/dL   GFR calc non Af Amer >60 >60 mL/min   GFR calc Af Amer >60 >60 mL/min    Comment: (NOTE) The eGFR has been calculated using the CKD EPI equation. This calculation has not been validated in all clinical situations. eGFR's persistently <60 mL/min signify possible Chronic Kidney Disease.    Anion gap 7 5 - 15  Lipase, blood     Status: None   Collection Time: 03/23/15 11:52 AM  Result Value Ref Range   Lipase 36 11 - 51 U/L  MRSA PCR Screening     Status: Abnormal   Collection Time: 03/23/15  5:45 PM  Result Value Ref Range   MRSA by PCR POSITIVE (A) NEGATIVE    Comment:        The GeneXpert MRSA Assay (FDA approved for NASAL specimens only), is one component of a comprehensive MRSA colonization surveillance program. It is not intended to diagnose MRSA infection nor to guide or monitor treatment for MRSA infections. RESULT CALLED TO, READ BACK BY AND VERIFIED WITH: HAMILTON S AT 2025 ON 122016 BY FORSYTH K   Comprehensive metabolic panel     Status: Abnormal   Collection Time: 03/24/15  5:23 AM  Result Value Ref Range   Sodium 141 135 - 145 mmol/L   Potassium 4.6 3.5 - 5.1 mmol/L   Chloride 107 101 - 111 mmol/L   CO2 28 22 - 32 mmol/L   Glucose, Bld 98 65 - 99 mg/dL   BUN 12 6 - 20 mg/dL   Creatinine, Ser 0.98 0.44 - 1.00 mg/dL   Calcium 9.1 8.9 - 10.3 mg/dL   Total Protein 6.2 (L) 6.5 - 8.1 g/dL   Albumin 3.1 (L) 3.5 - 5.0 g/dL   AST 13 (L) 15 - 41 U/L   ALT 13 (L) 14 - 54 U/L   Alkaline Phosphatase 66 38 - 126 U/L   Total Bilirubin 0.6 0.3 - 1.2 mg/dL   GFR calc non Af Amer 56 (L) >60 mL/min   GFR calc Af Amer >60 >60 mL/min    Comment: (NOTE) The eGFR has been calculated using the CKD EPI equation. This calculation has not been validated in all clinical situations.  eGFR's persistently <60 mL/min signify possible Chronic  Kidney Disease.    Anion gap 6 5 - 15  CBC     Status: Abnormal   Collection Time: 03/24/15  5:23 AM  Result Value Ref Range   WBC 6.0 4.0 - 10.5 K/uL   RBC 3.78 (L) 3.87 - 5.11 MIL/uL   Hemoglobin 11.6 (L) 12.0 - 15.0 g/dL   HCT 34.4 (L) 36.0 - 46.0 %   MCV 91.0 78.0 - 100.0 fL   MCH 30.7 26.0 - 34.0 pg   MCHC 33.7 30.0 - 36.0 g/dL   RDW 13.0 11.5 - 15.5 %   Platelets 238 150 - 400 K/uL    Ct Abdomen Pelvis Wo Contrast  03/23/2015  CLINICAL DATA:  Right upper quadrant flank pain since yesterday. Iodine allergy EXAM: CT ABDOMEN AND PELVIS WITHOUT CONTRAST TECHNIQUE: Multidetector CT imaging of the abdomen and pelvis was performed following the standard protocol without IV contrast. COMPARISON:  09/15/2011 FINDINGS: Lower chest and abdominal wall:  No contributory findings. Hepatobiliary: No focal liver abnormality.No evidence of biliary stone. Pancreas: Unremarkable. Spleen: Unremarkable. Adrenals/Urinary Tract: Negative adrenals. No hydronephrosis or stone. Unremarkable bladder. Reproductive:Hysterectomy with negative adnexa Stomach/Bowel:  No obstruction. No appendicitis. Vascular/Lymphatic: No acute vascular abnormality. No mass or adenopathy. Peritoneal: No ascites or pneumoperitoneum. Musculoskeletal: Lumbar degenerative disc narrowing and bulging with L4-5 laminectomy. IMPRESSION: No acute finding or explanation for symptoms. Electronically Signed   By: Monte Fantasia M.D.   On: 03/23/2015 02:01   Dg Chest 2 View  03/23/2015  CLINICAL DATA:  Cough for several days.  Low-grade fever EXAM: CHEST  2 VIEW COMPARISON:  09/16/2013 FINDINGS: Normal heart size and mediastinal contours. No acute infiltrate or edema. No effusion or pneumothorax. No acute osseous findings. IMPRESSION: No active cardiopulmonary disease. Electronically Signed   By: Monte Fantasia M.D.   On: 03/23/2015 01:54   US Abdomen Limited Ruq/gall Gladder  03/23/2015  CLINICAL DATA:  Patient with right upper quadrant  pain radiating to the right flank for 3 days. EXAM: US ABDOMEN LIMITED - RIGHT UPPER QUADRANT COMPARISON:  CT abdomen pelvis earlier same date. FINDINGS: Gallbladder: Multiple mobile gallstones are demonstrated within the gallbladder lumen. The gallbladder wall is thickened with associated edema. Negative sonographic Murphy's sign. Common bile duct: Diameter: 7 mm Liver: No focal lesion identified. Within normal limits in parenchymal echogenicity. IMPRESSION: Multiple mobile gallstones demonstrated within the gallbladder lumen with associated gallbladder wall thickening. Negative sonographic Murphy's sign. Findings are nonspecific however may represent acute cholecystitis in the appropriate clinical setting. Consider correlation with HIDA scan. Prominent common bile duct.  Consider correlation with LFTs. These results will be called to the ordering clinician or representative by the Radiologist Assistant, and communication documented in the PACS or zVision Dashboard. Electronically Signed   By: Lovey Newcomer M.D.   On: 03/23/2015 10:42    ROS: See chart Blood pressure 135/62, pulse 72, temperature 97.9 F (36.6 C), temperature source Oral, resp. rate 20, height 4' 9"  (1.448 m), weight 45.36 kg (100 lb), SpO2 98 %. Physical Exam: Pleasant white female in no acute distress. Her abdomen is soft with tenderness noted right upper quadrant to deep palpation. No hepatosplenomegaly, masses, or hernias identified. No rigidity is noted.  Assessment/Plan: Impression: Cholecystitis, cholelithiasis Plan: Patient will be taken to the operating room today for laparoscopic cholecystectomy. The risks and benefits of the procedure including bleeding, infection, hepatobiliary injury, and the possibility of an open procedure were fully explained to the patient, who gave  informed consent.  Neilson Oehlert A 03/24/2015, 7:46 AM

## 2015-03-24 NOTE — Anesthesia Preprocedure Evaluation (Signed)
Anesthesia Evaluation  Patient identified by MRN, date of birth, ID band Patient awake    Reviewed: Allergy & Precautions, H&P , NPO status , Patient's Chart, lab work & pertinent test results  History of Anesthesia Complications (+) PONV, Family history of anesthesia reaction and history of anesthetic complications  Airway Mallampati: II  TM Distance: >3 FB Neck ROM: Full    Dental  (+) Teeth Intact   Pulmonary neg pulmonary ROS, former smoker,    Pulmonary exam normal breath sounds clear to auscultation       Cardiovascular negative cardio ROS Normal cardiovascular exam Rhythm:Regular Rate:Normal     Neuro/Psych  Headaches, PSYCHIATRIC DISORDERS Anxiety Depression  Neuromuscular disease negative neurological ROS  negative psych ROS   GI/Hepatic Neg liver ROS, GERD  Medicated,  Endo/Other  negative endocrine ROS  Renal/GU negative Renal ROS  negative genitourinary   Musculoskeletal negative musculoskeletal ROS (+) Fibromyalgia -  Abdominal   Peds negative pediatric ROS (+)  Hematology negative hematology ROS (+)   Anesthesia Other Findings   Reproductive/Obstetrics negative OB ROS                             Anesthesia Physical Anesthesia Plan  ASA: III  Anesthesia Plan: General   Post-op Pain Management:    Induction: Intravenous  Airway Management Planned: Oral ETT  Additional Equipment:   Intra-op Plan:   Post-operative Plan: Extubation in OR  Informed Consent: I have reviewed the patients History and Physical, chart, labs and discussed the procedure including the risks, benefits and alternatives for the proposed anesthesia with the patient or authorized representative who has indicated his/her understanding and acceptance.     Plan Discussed with:   Anesthesia Plan Comments:         Anesthesia Quick Evaluation

## 2015-03-25 ENCOUNTER — Encounter (HOSPITAL_COMMUNITY): Payer: Self-pay | Admitting: General Surgery

## 2015-03-25 LAB — HEPATIC FUNCTION PANEL
ALBUMIN: 3.2 g/dL — AB (ref 3.5–5.0)
ALK PHOS: 65 U/L (ref 38–126)
ALT: 71 U/L — AB (ref 14–54)
AST: 99 U/L — AB (ref 15–41)
BILIRUBIN DIRECT: 0.1 mg/dL (ref 0.1–0.5)
BILIRUBIN TOTAL: 0.7 mg/dL (ref 0.3–1.2)
Indirect Bilirubin: 0.6 mg/dL (ref 0.3–0.9)
Total Protein: 6.3 g/dL — ABNORMAL LOW (ref 6.5–8.1)

## 2015-03-25 LAB — CBC
HEMATOCRIT: 34.9 % — AB (ref 36.0–46.0)
HEMOGLOBIN: 11.9 g/dL — AB (ref 12.0–15.0)
MCH: 30.9 pg (ref 26.0–34.0)
MCHC: 34.1 g/dL (ref 30.0–36.0)
MCV: 90.6 fL (ref 78.0–100.0)
Platelets: 244 10*3/uL (ref 150–400)
RBC: 3.85 MIL/uL — ABNORMAL LOW (ref 3.87–5.11)
RDW: 12.7 % (ref 11.5–15.5)
WBC: 6.3 10*3/uL (ref 4.0–10.5)

## 2015-03-25 LAB — BASIC METABOLIC PANEL
ANION GAP: 8 (ref 5–15)
BUN: 10 mg/dL (ref 6–20)
CHLORIDE: 104 mmol/L (ref 101–111)
CO2: 26 mmol/L (ref 22–32)
Calcium: 8.7 mg/dL — ABNORMAL LOW (ref 8.9–10.3)
Creatinine, Ser: 0.82 mg/dL (ref 0.44–1.00)
GFR calc Af Amer: >60 mL/min (ref >60)
Glucose, Bld: 95 mg/dL (ref 65–99)
POTASSIUM: 4 mmol/L (ref 3.5–5.1)
SODIUM: 138 mmol/L (ref 135–145)

## 2015-03-25 NOTE — Discharge Summary (Signed)
Physician Discharge Summary  Patient ID: Valerie Hampton MRN: [id number] DOB/AGE: Apr 18, 1940 74 y.o.  Admit date: 03/23/2015 Discharge date: 03/25/2015  Admission Diagnoses: Cholecystitis, cholelithiasis  Discharge Diagnoses: Same Principal Problem:   Biliary colic Active Problems:   Cholelithiasis   Acute calculous cholecystitis  hypertension  Discharged Condition: good  Hospital Course: Patient is a 74 year old white female who presented emergency room with worsening right upper quadrant abdominal pain. Ultrasound the gallbladder revealed cholelithiasis with a normal common bile duct. A surgery consultation was obtained and the patient subsequently underwent laparoscopic cholecystectomy on 03/24/2015. She tolerated the procedure well. Her postoperative course was unremarkable. Her diet was advanced at difficulty. She is being discharged home on 03/25/2015 in good and improving condition.  Treatments: surgery: Laparoscopic cholecystectomy on 03/24/2015  Discharge Exam: Blood pressure 128/56, pulse 76, temperature 98.5 F (36.9 C), temperature source Oral, resp. rate 20, height 4\' 9"  (1.448 m), weight 45.36 kg (100 lb), SpO2 99 %. General appearance: alert, cooperative and no distress Resp: clear to auscultation bilaterally Cardio: regular rate and rhythm, S1, S2 normal, no murmur, click, rub or gallop GI: Soft. Dressings dry and intact.  Disposition: 01-Home or Self Care     Medication List    STOP taking these medications        oxyCODONE 5 MG immediate release tablet  Commonly known as:  Oxy IR/ROXICODONE      TAKE these medications        acetaminophen 500 MG tablet  Commonly known as:  TYLENOL  Take 1,000 mg by mouth every 6 (six) hours as needed for fever.     ALPRAZolam 0.25 MG tablet  Commonly known as:  XANAX  Take 0.25 mg by mouth 2 (two) times daily as needed for anxiety.     CORICIDIN HBP COUGH/COLD PO  Take 1 capsule by mouth every 4 (four) hours  as needed (cold).     docusate sodium 100 MG capsule  Commonly known as:  COLACE  Take 100 mg by mouth 2 (two) times daily.     HYDROcodone-acetaminophen 5-325 MG tablet  Commonly known as:  NORCO/VICODIN  Take 1 tablet by mouth every 6 (six) hours as needed for moderate pain.     magnesium hydroxide 400 MG/5ML suspension  Commonly known as:  MILK OF MAGNESIA  Take 30 mLs by mouth daily as needed for mild constipation.     methocarbamol 500 MG tablet  Commonly known as:  ROBAXIN  Take 1 tablet (500 mg total) by mouth every 6 (six) hours as needed for muscle spasms.     omeprazole 40 MG capsule  Commonly known as:  PRILOSEC  Take 1 capsule by mouth daily.     omeprazole 20 MG capsule  Commonly known as:  PRILOSEC  Take 1 po BID x 2 weeks then once a day     ondansetron 4 MG tablet  Commonly known as:  ZOFRAN  Take 1 tablet (4 mg total) by mouth every 8 (eight) hours as needed.     phenylephrine 10 MG Tabs tablet  Commonly known as:  SUDAFED PE  Take 10 mg by mouth at bedtime.     polyethylene glycol packet  Commonly known as:  MIRALAX / GLYCOLAX  Take 17 g by mouth daily.     triamcinolone cream 0.5 %  Commonly known as:  KENALOG  Apply 1 application topically 3 (three) times daily.           Follow-up Information  Follow up with Jamesetta So, MD. Schedule an appointment as soon as possible for a visit on 04/01/2015.   Specialty:  General Surgery   Contact information:   [address] [place] [id number] [phone]       Signed: Aviva Signs A 03/25/2015, 8:40 AM

## 2015-03-25 NOTE — Discharge Instructions (Signed)
Laparoscopic Cholecystectomy, Care After °Refer to this sheet in the next few weeks. These instructions provide you with information about caring for yourself after your procedure. Your health care provider may also give you more specific instructions. Your treatment has been planned according to current medical practices, but problems sometimes occur. Call your health care provider if you have any problems or questions after your procedure. °WHAT TO EXPECT AFTER THE PROCEDURE °After your procedure, it is common to have: °· Pain at your incision sites. You will be given pain medicines to control your pain. °· Mild nausea or vomiting. This should improve after the first 24 hours. °· Bloating and possible shoulder pain from the gas that was used during the procedure. This will improve after the first 24 hours. °HOME CARE INSTRUCTIONS °Incision Care °· Follow instructions from your health care provider about how to take care of your incisions. Make sure you: °¨ Wash your hands with soap and water before you change your bandage (dressing). If soap and water are not available, use hand sanitizer. °¨ Change your dressing as told by your health care provider. °¨ Leave stitches (sutures), skin glue, or adhesive strips in place. These skin closures may need to be in place for 2 weeks or longer. If adhesive strip edges start to loosen and curl up, you may trim the loose edges. Do not remove adhesive strips completely unless your health care provider tells you to do that. °· Do not take baths, swim, or use a hot tub until your health care provider approves. Ask your health care provider if you can take showers. You may only be allowed to take sponge baths for bathing. °General Instructions °· Take over-the-counter and prescription medicines only as told by your health care provider. °· Do not drive or operate heavy machinery while taking prescription pain medicine. °· Return to your normal diet as told by your health care  provider. °· Do not lift anything that is heavier than 10 lb (4.5 kg). °· Do not play contact sports for one week or until your health care provider approves. °SEEK MEDICAL CARE IF:  °· You have redness, swelling, or pain at the site of your incision. °· You have fluid, blood, or pus coming from your incision. °· You notice a bad smell coming from your incision area. °· Your surgical incisions break open. °· You have a fever. °SEEK IMMEDIATE MEDICAL CARE IF: °· You develop a rash. °· You have difficulty breathing. °· You have chest pain. °· You have increasing pain in your shoulders (shoulder strap areas). °· You faint or have dizzy episodes while you are standing. °· You have severe pain in your abdomen. °· You have nausea or vomiting that lasts for more than one day. °  °This information is not intended to replace advice given to you by your health care provider. Make sure you discuss any questions you have with your health care provider. °  °Document Released: 03/20/2005 Document Revised: 12/09/2014 Document Reviewed: 10/30/2012 °Elsevier Interactive Patient Education ©2016 [employer] ° °

## 2015-03-25 NOTE — Progress Notes (Signed)
Left via wheelchair accompanied by staff and family for discharge home in care of family.  Stable at discharge.

## 2015-03-25 NOTE — Progress Notes (Signed)
IV access removed.  Abdominal surgical DDI, discharge instructions reviewed with patient and family, Questions answered, verbalized understanding.

## 2015-03-25 NOTE — Care Management Note (Signed)
Case Management Note  Patient Details  Name: FLOREE LITWAK MRN: [id number] Date of Birth: November 20, 1940  Subjective/Objective:                  Pt admitted with cholecystitis and s/p lap choley. Pt lives with her husband and will return home at discharge. Pt is independent with ADL's.  Action/Plan: Pt discharged home today. No CM needs noted.  Expected Discharge Date:  03/26/15               Expected Discharge Plan:  Home/Self Care  In-House Referral:  NA  Discharge planning Services  CM Consult  Post Acute Care Choice:  NA Choice offered to:  NA  DME Arranged:    DME Agency:     HH Arranged:    HH Agency:     Status of Service:  Completed, signed off  Medicare Important Message Given:    Date Medicare IM Given:    Medicare IM give by:    Date Additional Medicare IM Given:    Additional Medicare Important Message give by:     If discussed at [place] of Stay Meetings, dates discussed:    Additional Comments:  Joylene Draft, RN 03/25/2015, 8:57 AM

## 2015-06-14 DIAGNOSIS — F419 Anxiety disorder, unspecified: Secondary | ICD-10-CM | POA: Diagnosis not present

## 2015-06-14 DIAGNOSIS — Z0000 Encounter for general adult medical examination without abnormal findings: Secondary | ICD-10-CM | POA: Diagnosis not present

## 2015-06-14 DIAGNOSIS — Z1389 Encounter for screening for other disorder: Secondary | ICD-10-CM | POA: Diagnosis not present

## 2015-06-14 DIAGNOSIS — M810 Age-related osteoporosis without current pathological fracture: Secondary | ICD-10-CM | POA: Diagnosis not present

## 2015-06-28 DIAGNOSIS — M25831 Other specified joint disorders, right wrist: Secondary | ICD-10-CM | POA: Diagnosis not present

## 2015-06-28 DIAGNOSIS — M25531 Pain in right wrist: Secondary | ICD-10-CM | POA: Diagnosis not present

## 2015-07-22 DIAGNOSIS — M25531 Pain in right wrist: Secondary | ICD-10-CM | POA: Diagnosis not present

## 2015-08-05 DIAGNOSIS — M25531 Pain in right wrist: Secondary | ICD-10-CM | POA: Diagnosis not present

## 2015-08-05 DIAGNOSIS — M25831 Other specified joint disorders, right wrist: Secondary | ICD-10-CM | POA: Diagnosis not present

## 2015-09-20 DIAGNOSIS — M25531 Pain in right wrist: Secondary | ICD-10-CM | POA: Diagnosis not present

## 2015-12-13 DIAGNOSIS — W57XXXA Bitten or stung by nonvenomous insect and other nonvenomous arthropods, initial encounter: Secondary | ICD-10-CM | POA: Diagnosis not present

## 2015-12-13 DIAGNOSIS — L539 Erythematous condition, unspecified: Secondary | ICD-10-CM | POA: Diagnosis not present

## 2015-12-15 DIAGNOSIS — R413 Other amnesia: Secondary | ICD-10-CM | POA: Diagnosis not present

## 2015-12-15 DIAGNOSIS — F419 Anxiety disorder, unspecified: Secondary | ICD-10-CM | POA: Diagnosis not present

## 2015-12-15 DIAGNOSIS — Z23 Encounter for immunization: Secondary | ICD-10-CM | POA: Diagnosis not present

## 2015-12-17 DIAGNOSIS — J342 Deviated nasal septum: Secondary | ICD-10-CM | POA: Diagnosis not present

## 2015-12-17 DIAGNOSIS — Z87891 Personal history of nicotine dependence: Secondary | ICD-10-CM | POA: Diagnosis not present

## 2015-12-17 DIAGNOSIS — J302 Other seasonal allergic rhinitis: Secondary | ICD-10-CM | POA: Diagnosis not present

## 2015-12-17 DIAGNOSIS — H6121 Impacted cerumen, right ear: Secondary | ICD-10-CM | POA: Diagnosis not present

## 2015-12-17 DIAGNOSIS — J343 Hypertrophy of nasal turbinates: Secondary | ICD-10-CM | POA: Diagnosis not present

## 2016-04-13 DIAGNOSIS — J069 Acute upper respiratory infection, unspecified: Secondary | ICD-10-CM | POA: Diagnosis not present

## 2016-07-07 DIAGNOSIS — M810 Age-related osteoporosis without current pathological fracture: Secondary | ICD-10-CM | POA: Diagnosis not present

## 2016-07-07 DIAGNOSIS — E7800 Pure hypercholesterolemia, unspecified: Secondary | ICD-10-CM | POA: Diagnosis not present

## 2016-07-07 DIAGNOSIS — Z0000 Encounter for general adult medical examination without abnormal findings: Secondary | ICD-10-CM | POA: Diagnosis not present

## 2016-07-07 DIAGNOSIS — M542 Cervicalgia: Secondary | ICD-10-CM | POA: Diagnosis not present

## 2016-07-07 DIAGNOSIS — Z23 Encounter for immunization: Secondary | ICD-10-CM | POA: Diagnosis not present

## 2016-07-07 DIAGNOSIS — M545 Low back pain: Secondary | ICD-10-CM | POA: Diagnosis not present

## 2016-07-07 DIAGNOSIS — F324 Major depressive disorder, single episode, in partial remission: Secondary | ICD-10-CM | POA: Diagnosis not present

## 2016-07-07 DIAGNOSIS — F411 Generalized anxiety disorder: Secondary | ICD-10-CM | POA: Diagnosis not present

## 2016-07-07 DIAGNOSIS — Z1389 Encounter for screening for other disorder: Secondary | ICD-10-CM | POA: Diagnosis not present

## 2016-09-05 ENCOUNTER — Other Ambulatory Visit: Payer: Self-pay | Admitting: Internal Medicine

## 2016-09-05 DIAGNOSIS — Z1231 Encounter for screening mammogram for malignant neoplasm of breast: Secondary | ICD-10-CM

## 2016-09-06 ENCOUNTER — Other Ambulatory Visit: Payer: Self-pay | Admitting: Internal Medicine

## 2016-09-06 DIAGNOSIS — M810 Age-related osteoporosis without current pathological fracture: Secondary | ICD-10-CM

## 2016-11-21 DIAGNOSIS — R21 Rash and other nonspecific skin eruption: Secondary | ICD-10-CM | POA: Diagnosis not present

## 2016-11-21 DIAGNOSIS — W57XXXA Bitten or stung by nonvenomous insect and other nonvenomous arthropods, initial encounter: Secondary | ICD-10-CM | POA: Diagnosis not present

## 2016-12-27 ENCOUNTER — Ambulatory Visit: Payer: Commercial Managed Care - HMO

## 2016-12-27 ENCOUNTER — Ambulatory Visit
Admission: RE | Admit: 2016-12-27 | Discharge: 2016-12-27 | Disposition: A | Discharge status: Home / Self Care | Payer: PPO | Source: Ambulatory Visit | Attending: Internal Medicine | Admitting: Internal Medicine

## 2016-12-27 DIAGNOSIS — Z1231 Encounter for screening mammogram for malignant neoplasm of breast: Secondary | ICD-10-CM | POA: Diagnosis not present

## 2016-12-27 DIAGNOSIS — L658 Other specified nonscarring hair loss: Secondary | ICD-10-CM | POA: Diagnosis not present

## 2016-12-27 DIAGNOSIS — Z780 Asymptomatic menopausal state: Secondary | ICD-10-CM | POA: Diagnosis not present

## 2016-12-27 DIAGNOSIS — M810 Age-related osteoporosis without current pathological fracture: Secondary | ICD-10-CM | POA: Diagnosis not present

## 2017-01-11 DIAGNOSIS — L659 Nonscarring hair loss, unspecified: Secondary | ICD-10-CM | POA: Diagnosis not present

## 2017-02-07 DIAGNOSIS — L989 Disorder of the skin and subcutaneous tissue, unspecified: Secondary | ICD-10-CM | POA: Diagnosis not present

## 2017-02-07 DIAGNOSIS — L821 Other seborrheic keratosis: Secondary | ICD-10-CM | POA: Diagnosis not present

## 2017-02-07 DIAGNOSIS — D225 Melanocytic nevi of trunk: Secondary | ICD-10-CM | POA: Diagnosis not present

## 2017-02-07 DIAGNOSIS — D1801 Hemangioma of skin and subcutaneous tissue: Secondary | ICD-10-CM | POA: Diagnosis not present

## 2017-02-07 DIAGNOSIS — L814 Other melanin hyperpigmentation: Secondary | ICD-10-CM | POA: Diagnosis not present

## 2017-02-07 DIAGNOSIS — L658 Other specified nonscarring hair loss: Secondary | ICD-10-CM | POA: Diagnosis not present

## 2017-05-10 DIAGNOSIS — R05 Cough: Secondary | ICD-10-CM | POA: Diagnosis not present

## 2017-07-17 DIAGNOSIS — M545 Low back pain: Secondary | ICD-10-CM | POA: Diagnosis not present

## 2017-07-26 DIAGNOSIS — Z0000 Encounter for general adult medical examination without abnormal findings: Secondary | ICD-10-CM | POA: Diagnosis not present

## 2017-07-26 DIAGNOSIS — R252 Cramp and spasm: Secondary | ICD-10-CM | POA: Diagnosis not present

## 2017-07-26 DIAGNOSIS — Z1389 Encounter for screening for other disorder: Secondary | ICD-10-CM | POA: Diagnosis not present

## 2017-07-26 DIAGNOSIS — M810 Age-related osteoporosis without current pathological fracture: Secondary | ICD-10-CM | POA: Diagnosis not present

## 2017-07-26 DIAGNOSIS — Z23 Encounter for immunization: Secondary | ICD-10-CM | POA: Diagnosis not present

## 2017-07-26 DIAGNOSIS — F324 Major depressive disorder, single episode, in partial remission: Secondary | ICD-10-CM | POA: Diagnosis not present

## 2017-07-26 DIAGNOSIS — F419 Anxiety disorder, unspecified: Secondary | ICD-10-CM | POA: Diagnosis not present

## 2017-07-26 DIAGNOSIS — M545 Low back pain: Secondary | ICD-10-CM | POA: Diagnosis not present

## 2017-07-26 DIAGNOSIS — R5383 Other fatigue: Secondary | ICD-10-CM | POA: Diagnosis not present

## 2017-07-26 DIAGNOSIS — E7800 Pure hypercholesterolemia, unspecified: Secondary | ICD-10-CM | POA: Diagnosis not present

## 2017-07-26 DIAGNOSIS — R002 Palpitations: Secondary | ICD-10-CM | POA: Diagnosis not present

## 2017-11-20 ENCOUNTER — Ambulatory Visit (HOSPITAL_COMMUNITY)
Admission: EM | Admit: 2017-11-20 | Discharge: 2017-11-20 | Disposition: A | Discharge status: Home / Self Care | Payer: PPO

## 2017-11-20 ENCOUNTER — Encounter (HOSPITAL_COMMUNITY): Payer: Self-pay | Admitting: Emergency Medicine

## 2017-11-20 DIAGNOSIS — W57XXXA Bitten or stung by nonvenomous insect and other nonvenomous arthropods, initial encounter: Secondary | ICD-10-CM | POA: Diagnosis not present

## 2017-11-20 DIAGNOSIS — S1086XA Insect bite of other specified part of neck, initial encounter: Secondary | ICD-10-CM

## 2017-11-20 DIAGNOSIS — L03221 Cellulitis of neck: Secondary | ICD-10-CM

## 2017-11-20 MED ORDER — DIPHENHYDRAMINE HCL 50 MG/ML IJ SOLN
25.0000 mg | Freq: Once | INTRAMUSCULAR | Status: AC
  Administered 2017-11-20: 25 mg via INTRAMUSCULAR

## 2017-11-20 MED ORDER — HYDROXYZINE HCL 25 MG PO TABS: 25.0000 mg | ORAL_TABLET | Freq: Four times a day (QID) | ORAL | 0 refills | Status: DC | PRN

## 2017-11-20 MED ORDER — METHYLPREDNISOLONE ACETATE 80 MG/ML IJ SUSP
INTRAMUSCULAR | Status: AC
  Filled 2017-11-20: qty 1

## 2017-11-20 MED ORDER — METHYLPREDNISOLONE ACETATE 80 MG/ML IJ SUSP
80.0000 mg | Freq: Once | INTRAMUSCULAR | Status: AC
  Administered 2017-11-20: 80 mg via INTRAMUSCULAR

## 2017-11-20 MED ORDER — PREDNISONE 20 MG PO TABS: 20.0000 mg | ORAL_TABLET | Freq: Every day | ORAL | 0 refills | Status: AC

## 2017-11-20 MED ORDER — DOXYCYCLINE HYCLATE 100 MG PO CAPS: 100.0000 mg | ORAL_CAPSULE | Freq: Two times a day (BID) | ORAL | 0 refills | Status: AC

## 2017-11-20 MED ORDER — DIPHENHYDRAMINE HCL 50 MG/ML IJ SOLN
INTRAMUSCULAR | Status: AC
  Filled 2017-11-20: qty 1

## 2017-11-20 NOTE — ED Provider Notes (Signed)
[facility]    CSN: [id number] Arrival date & time: 11/20/17  1706     History   Chief Complaint Chief Complaint  Patient presents with  . Insect Bite    HPI Valerie Hampton is a 77 y.o. female.   History of Present Illness   Valerie Hampton is a 77 y.o. female that presents for evaluation of rash and itching to her neck. Onset of symptoms was abrupt starting around 7 pm on the evening of 11/19/17 after being bitten by a yellow jacket. She initially had pain to the area which was relieved with Aleve. Throughout today, there has been severe itching and increased redness which has been gradually worsening. Patient denies chest pain, shortness of breath, wheezing, throat irritation/swelling, difficulty swallowing or mouth/tonuge swelling. Patient denies similar previous allergic reactions. Care prior to arrival consisted of aleve (which resolved the pain) and benadryl with no relief in the itching.        Past Medical History:  Diagnosis Date  . Allergic rhinitis   . Anxiety   . Arthritis   . Cataract   . Chronic back pain   . Depression   . Family history of anesthesia complication    " sister went out ? "   . Fibromyalgia   . GERD (gastroesophageal reflux disease)   . Headache(784.0)    due ot facial nerve condition   . Intraductal papilloma 2007   no evidence of malignancy  . Occipital neuralgia   . Osteopenia   . PONV (postoperative nausea and vomiting)     Patient Active Problem List   Diagnosis Date Noted  . Biliary colic 00/93/8182  . Cholelithiasis 03/23/2015  . Acute calculous cholecystitis 03/23/2015  . Spinal stenosis, lumbar region, with neurogenic claudication 09/09/2013  . Herniated lumbar intervertebral disc 09/09/2013  . Spinal stenosis of lumbar region 09/06/2013  . Right facial pain 03/11/2013  . CARCINOMA IN SITU OF BREAST 10/10/2007  . ESOPHAGEAL STRICTURE 10/10/2007  . CONSTIPATION 10/10/2007  . IRRITABLE BOWEL SYNDROME  10/10/2007  . CERVICAL DISC DISORDER 10/10/2007  . CANDIDIASIS, ESOPHAGEAL 06/13/2007  . EROSIVE ESOPHAGITIS 06/13/2007  . ESOPHAGEAL MOTILITY DISORDER 06/13/2007  . GERD 06/13/2007  . FIBROMYALGIA 06/13/2007    Past Surgical History:  Procedure Laterality Date  . ABDOMINAL HYSTERECTOMY    . BACK SURGERY    . bladder tack    . BREAST EXCISIONAL BIOPSY Bilateral    benign  . BREAST SURGERY Bilateral   . CERVICAL SPINE SURGERY    . CHOLECYSTECTOMY N/A 03/24/2015   Procedure: LAPAROSCOPIC CHOLECYSTECTOMY;  Surgeon: Aviva Signs, MD;  Location: [facility];  Service: General;  Laterality: N/A;  . LUMBAR LAMINECTOMY/DECOMPRESSION MICRODISCECTOMY N/A 09/09/2013   Procedure: LUMBAR LAMINECTOMY/DECOMPRESSION L4-5 MICRODISCECTOMY LEFT L4-5;  Surgeon: Tobi Bastos, MD;  Location: [facility];  Service: Orthopedics;  Laterality: N/A;  . NECK SURGERY    . TEMPORAL ARTERY BIOPSY / LIGATION Right   . TUBAL LIGATION      OB History    Gravida  7   Para  4   Term  4   Preterm      AB  3   Living  3     SAB  3   TAB      Ectopic      Multiple      Live Births               Home Medications    Prior to Admission medications  Medication Sig Start Date End Date Taking? Authorizing Provider  ALPRAZolam (XANAX) 0.25 MG tablet Take 0.25 mg by mouth 2 (two) times daily as needed for anxiety.    Yes [provider]  citalopram (CELEXA) 10 MG tablet Take 10 mg by mouth daily.   Yes [provider]  omeprazole (PRILOSEC) 40 MG capsule Take 1 capsule by mouth daily. 03/15/15  Yes [provider]  polyethylene glycol (MIRALAX / GLYCOLAX) packet Take 17 g by mouth daily.    Yes [provider]  acetaminophen (TYLENOL) 500 MG tablet Take 1,000 mg by mouth every 6 (six) hours as needed for fever.    [provider]  Chlorpheniramine-DM (CORICIDIN HBP COUGH/COLD PO) Take 1 capsule by mouth every 4 (four) hours as needed (cold).    [provider]  docusate sodium (COLACE) 100 MG capsule Take 100 mg by mouth 2 (two) times daily.     [provider]  doxycycline (VIBRAMYCIN) 100 MG capsule Take 1 capsule (100 mg total) by mouth 2 (two) times daily for 5 days. 11/20/17 11/25/17  Enrique Sack, FNP  HYDROcodone-acetaminophen (NORCO/VICODIN) 5-325 MG tablet Take 1 tablet by mouth every 6 (six) hours as needed for moderate pain.    [provider]  hydrOXYzine (ATARAX/VISTARIL) 25 MG tablet Take 1 tablet (25 mg total) by mouth every 6 (six) hours as needed for itching. 11/20/17   Enrique Sack, FNP  magnesium hydroxide (MILK OF MAGNESIA) 400 MG/5ML suspension Take 30 mLs by mouth daily as needed for mild constipation.     [provider]  methocarbamol (ROBAXIN) 500 MG tablet Take 1 tablet (500 mg total) by mouth every 6 (six) hours as needed for muscle spasms. 09/11/13   Cecilio Asper, Amber, PA-C  omeprazole (PRILOSEC) 20 MG capsule Take 1 po BID x 2 weeks then once a day Patient not taking: Reported on 03/23/2015 03/23/15   Rolland Porter, MD  ondansetron (ZOFRAN) 4 MG tablet Take 1 tablet (4 mg total) by mouth every 8 (eight) hours as needed. 03/23/15   Rolland Porter, MD  phenylephrine (SUDAFED PE) 10 MG TABS tablet Take 10 mg by mouth at bedtime.     [provider]  predniSONE (DELTASONE) 20 MG tablet Take 1 tablet (20 mg total) by mouth daily for 5 days. 11/20/17 11/25/17  Enrique Sack, FNP  triamcinolone cream (KENALOG) 0.5 % Apply 1 application topically 3 (three) times daily. 03/22/15   [provider]    Family History Family History  Problem Relation Age of Onset  . Cancer - Colon Mother   . Cancer Mother        Breast     Social History Social History   Tobacco Use  . Smoking status: Former Smoker    Types: Cigarettes  . Smokeless tobacco: Never Used  Substance Use Topics  . Alcohol use: No  . Drug use: No     Allergies   Baclofen; Contrast media [iodinated  diagnostic agents]; Latex; Morphine and related; Neosporin [neomycin-bacitracin zn-polymyx]; Neosporin [neomycin-polymyxin-gramicidin]; Nsaids; Robaxin [methocarbamol]; Adhesive [tape]; Buspar [buspirone]; Carbamazepine; Ciprofloxacin; Iohexol; Oxycodone; Relafen [nabumetone]; and Zoloft [sertraline hcl]   Review of Systems Review of Systems  HENT: Negative for drooling and trouble swallowing.   Respiratory: Negative for shortness of breath and wheezing.   Skin: Positive for rash.  All other systems reviewed and are negative.    Physical Exam Triage Vital Signs ED Triage Vitals  Enc Vitals Group     BP 11/20/17 1754 Marland Kitchen)  141/56     Pulse Rate 11/20/17 1754 66     Resp 11/20/17 1754 16     Temp 11/20/17 1754 97.9 F (36.6 C)     Temp Source 11/20/17 1754 Oral     SpO2 11/20/17 1754 99 %     Weight 11/20/17 1755 100 lb (45.4 kg)     Height --      Head Circumference --      Peak Flow --      Pain Score 11/20/17 1754 7     Pain Loc --      Pain Edu? --      Excl. in [place]? --    No data found.  Updated Vital Signs BP (!) 141/56   Pulse 66   Temp 97.9 F (36.6 C) (Oral)   Resp 16   Wt 100 lb (45.4 kg)   SpO2 99%   BMI 21.64 kg/m   Visual Acuity Right Eye Distance:   Left Eye Distance:   Bilateral Distance:    Right Eye Near:   Left Eye Near:    Bilateral Near:     Physical Exam  Constitutional: She is oriented to person, place, and time. She appears well-developed and well-nourished. No distress.  HENT:  Head: Normocephalic.  Neck: Trachea normal, normal range of motion and full passive range of motion without pain. Neck supple. Erythema present.  Cardiovascular: Normal rate and regular rhythm.  Pulmonary/Chest: Effort normal and breath sounds normal.  Musculoskeletal: Normal range of motion.  Neurological: She is alert and oriented to person, place, and time.  Skin: Skin is warm and dry.       UC Treatments / Results  Labs (all labs ordered are listed,  but only abnormal results are displayed) Labs Reviewed - No data to display  EKG None  Radiology No results found.  Procedures Procedures (including critical care time)  Medications Ordered in UC Medications  methylPREDNISolone acetate (DEPO-MEDROL) injection 80 mg (has no administration in time range)  diphenhydrAMINE (BENADRYL) injection 25 mg (has no administration in time range)    Initial Impression / Assessment and Plan / UC Course  I have reviewed the triage vital signs and the nursing notes.  Pertinent labs & imaging results that were available during my care of the patient were reviewed by me and considered in my medical decision making (see chart for details).    77 yo female that presents with severe itching and erythema to the anterior neck after being stung by a yellow jacket 1 day ago. No difficulty swallowing, throat irritation/swelling, mouth/tongue swelling, shortness of breath or wheezing.  Will treat as an acute allergic reaction/possible cellulitis secondary to a insect bite.  Solumedrol 80 mg IM and benadryl 25 mg IM given in clinic. Patient did not have any relief with oral Benadryl at home.  Will prescribe Vistaril as well as a short course of steroids and doxycycline.   Today's evaluation has revealed no signs of a dangerous process. Discussed diagnosis with patient. Patient aware of their diagnosis, possible red flag symptoms to watch out for and need for close follow up. Patient understands verbal and written discharge instructions. Patient comfortable with plan and disposition.  Patient has a clear mental status at this time, good insight into illness (after discussion and teaching) and has clear judgment to make decisions regarding their care.  Documentation was completed with the aid of voice recognition software. Transcription may contain typographical errors. Final Clinical Impressions(s) / UC Diagnoses  Final diagnoses:  Insect bite of other part of  neck, initial encounter  Cellulitis of neck   Discharge Instructions   None    ED Prescriptions    Medication Sig Dispense Auth. Provider   hydrOXYzine (ATARAX/VISTARIL) 25 MG tablet Take 1 tablet (25 mg total) by mouth every 6 (six) hours as needed for itching. 12 tablet Enrique Sack, FNP   predniSONE (DELTASONE) 20 MG tablet Take 1 tablet (20 mg total) by mouth daily for 5 days. 5 tablet Enrique Sack, FNP   doxycycline (VIBRAMYCIN) 100 MG capsule Take 1 capsule (100 mg total) by mouth 2 (two) times daily for 5 days. 10 capsule Enrique Sack, FNP     Controlled Substance Prescriptions [place] Controlled Substance Registry consulted? Not Applicable   Enrique Sack, [place] 11/20/17 [phone]

## 2017-11-20 NOTE — ED Triage Notes (Signed)
PT had benadryl at 0600. PT had two more at 1400. PT took aleve at [employer]

## 2017-11-20 NOTE — ED Triage Notes (Signed)
PT was stung by a wasp or bee last night on her chin. PT took benadryl and pain medicine. PT reports itching started today. PT's neck is red and warm. No difficulty breathing.

## 2017-11-22 DIAGNOSIS — T63441D Toxic effect of venom of bees, accidental (unintentional), subsequent encounter: Secondary | ICD-10-CM | POA: Diagnosis not present

## 2017-11-27 ENCOUNTER — Other Ambulatory Visit: Payer: Self-pay | Admitting: Internal Medicine

## 2017-11-27 DIAGNOSIS — Z1231 Encounter for screening mammogram for malignant neoplasm of breast: Secondary | ICD-10-CM

## 2018-01-01 ENCOUNTER — Ambulatory Visit
Admission: RE | Admit: 2018-01-01 | Discharge: 2018-01-01 | Disposition: A | Discharge status: Home / Self Care | Payer: PPO | Source: Ambulatory Visit | Attending: Internal Medicine | Admitting: Internal Medicine

## 2018-01-01 DIAGNOSIS — Z1231 Encounter for screening mammogram for malignant neoplasm of breast: Secondary | ICD-10-CM

## 2018-01-07 DIAGNOSIS — Z23 Encounter for immunization: Secondary | ICD-10-CM | POA: Diagnosis not present

## 2018-01-24 DIAGNOSIS — M519 Unspecified thoracic, thoracolumbar and lumbosacral intervertebral disc disorder: Secondary | ICD-10-CM | POA: Diagnosis not present

## 2018-01-24 DIAGNOSIS — F419 Anxiety disorder, unspecified: Secondary | ICD-10-CM | POA: Diagnosis not present

## 2018-01-24 DIAGNOSIS — R252 Cramp and spasm: Secondary | ICD-10-CM | POA: Diagnosis not present

## 2018-01-24 DIAGNOSIS — F324 Major depressive disorder, single episode, in partial remission: Secondary | ICD-10-CM | POA: Diagnosis not present

## 2018-02-05 DIAGNOSIS — H25813 Combined forms of age-related cataract, bilateral: Secondary | ICD-10-CM | POA: Diagnosis not present

## 2018-02-05 DIAGNOSIS — G43109 Migraine with aura, not intractable, without status migrainosus: Secondary | ICD-10-CM | POA: Diagnosis not present

## 2018-02-05 DIAGNOSIS — H35372 Puckering of macula, left eye: Secondary | ICD-10-CM | POA: Diagnosis not present

## 2018-05-13 DIAGNOSIS — R0789 Other chest pain: Secondary | ICD-10-CM | POA: Diagnosis not present

## 2018-05-13 DIAGNOSIS — R51 Headache: Secondary | ICD-10-CM | POA: Diagnosis not present

## 2018-05-27 DIAGNOSIS — J069 Acute upper respiratory infection, unspecified: Secondary | ICD-10-CM | POA: Diagnosis not present

## 2018-09-12 DIAGNOSIS — R21 Rash and other nonspecific skin eruption: Secondary | ICD-10-CM | POA: Diagnosis not present

## 2018-11-11 IMAGING — MG DIGITAL SCREENING BILATERAL MAMMOGRAM WITH TOMO AND CAD
8 series · 8 of 24 positions shown · non-contrast
Comparison: Previous exam(s).

CLINICAL DATA: Screening.

EXAM:
DIGITAL SCREENING BILATERAL MAMMOGRAM WITH TOMO AND CAD

[R MLO synth-2D]
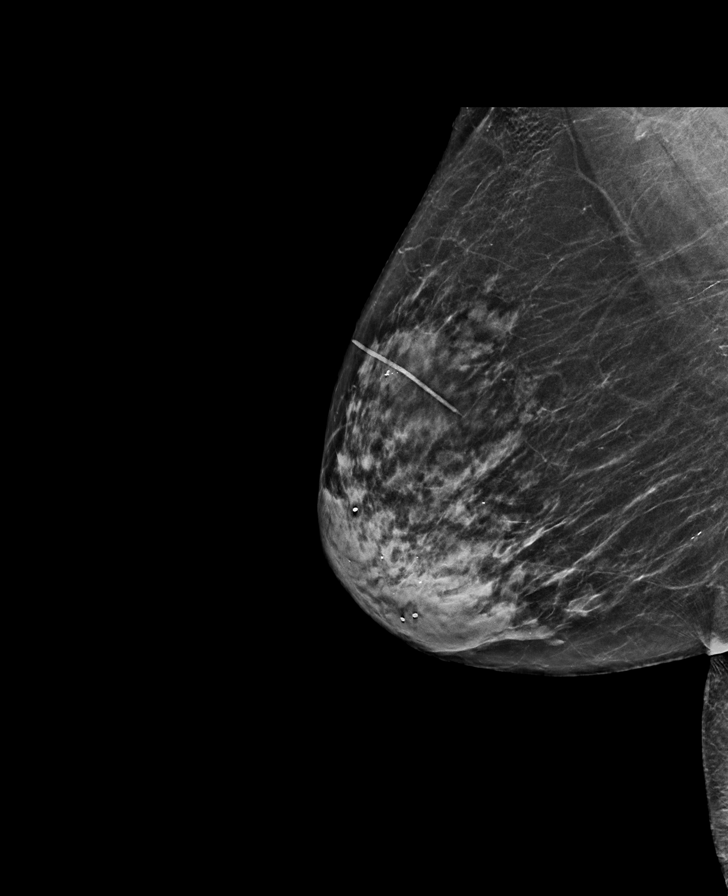

[L CC synth-2D]
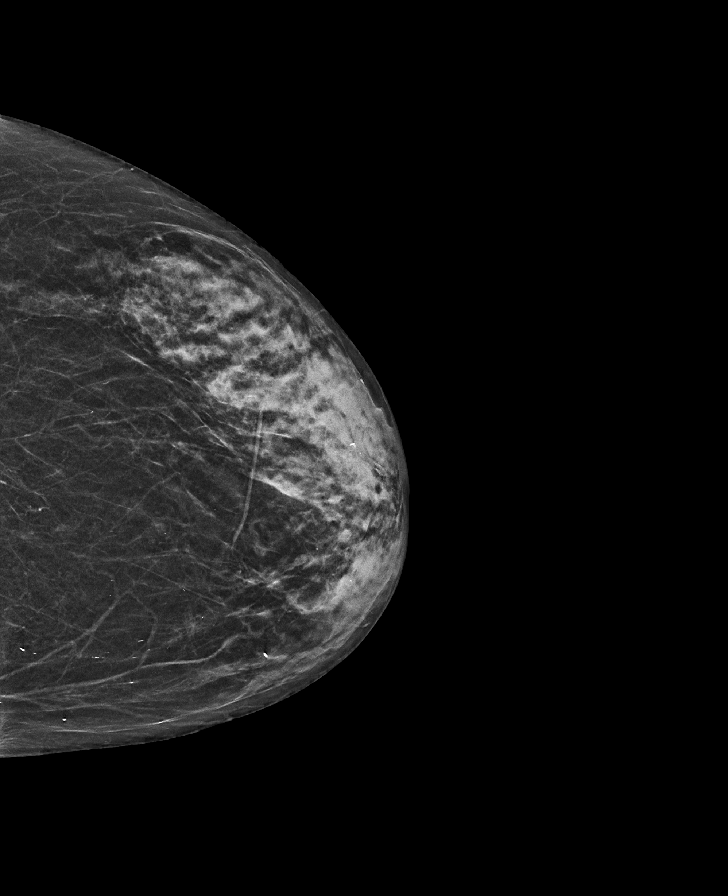

[L MLO synth-2D]
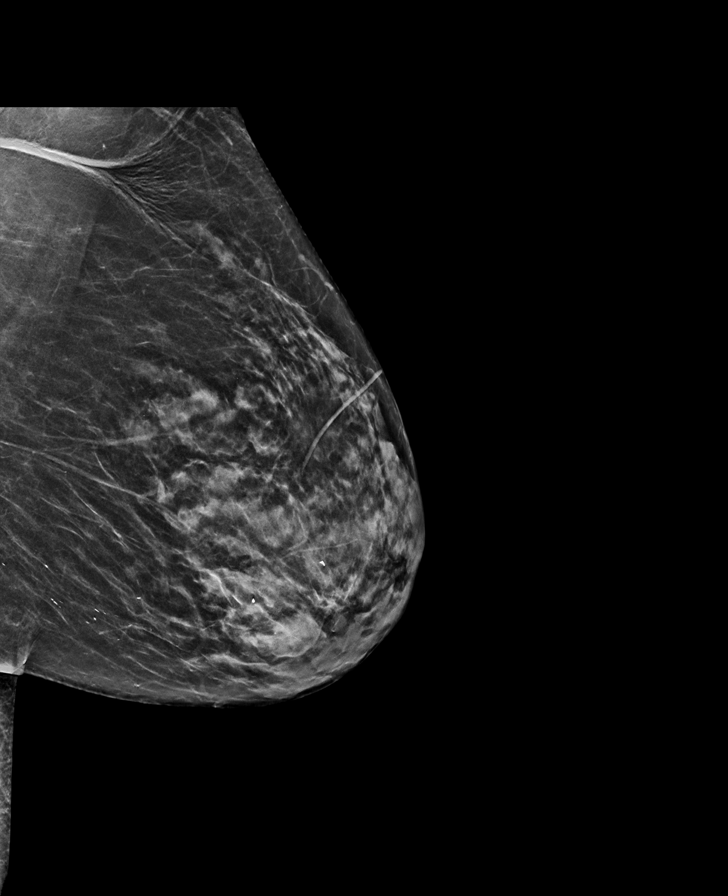

[R CC synth-2D]
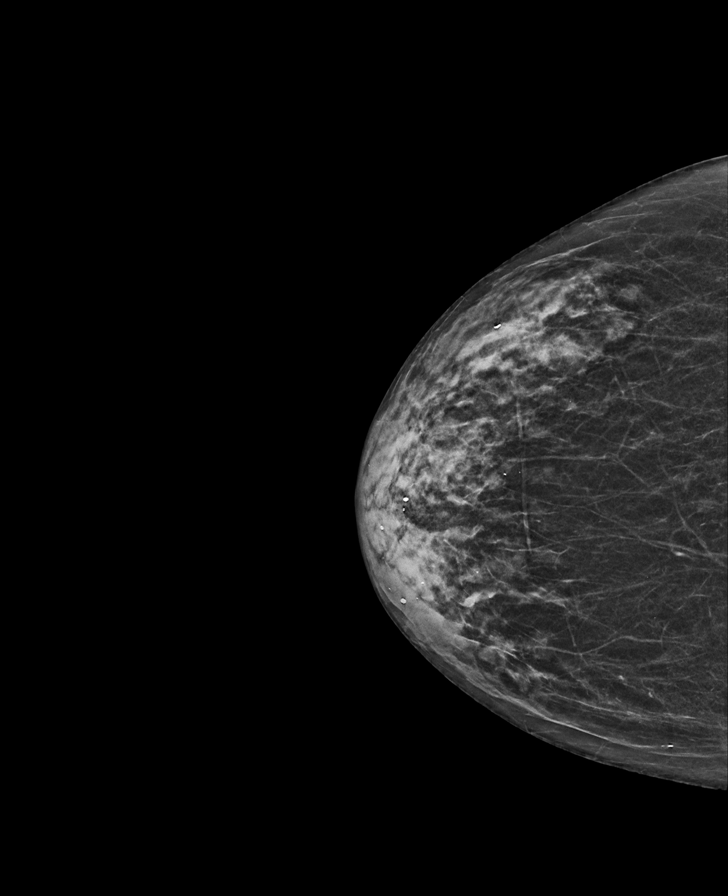

[R CC tomo · tomo slice 25/49.0]
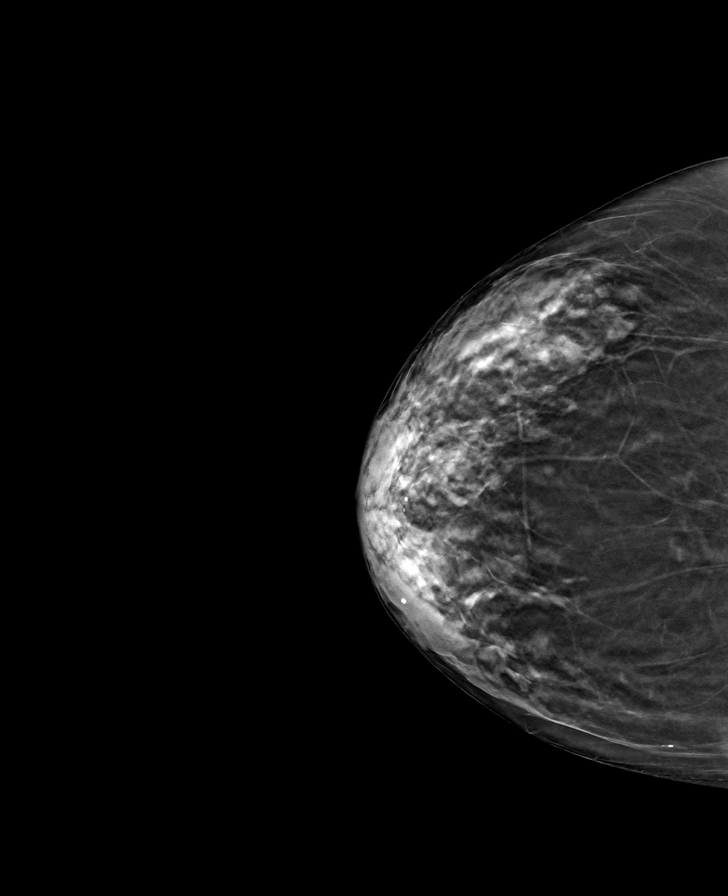

[L MLO tomo · tomo slice 29/56.0]
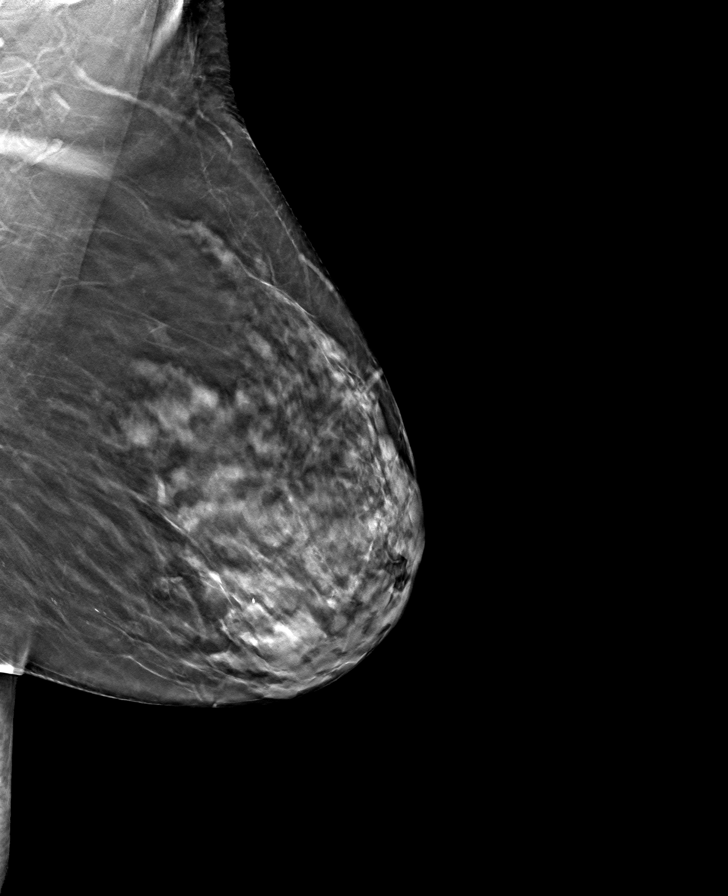

[L CC tomo · tomo slice 25/49.0]
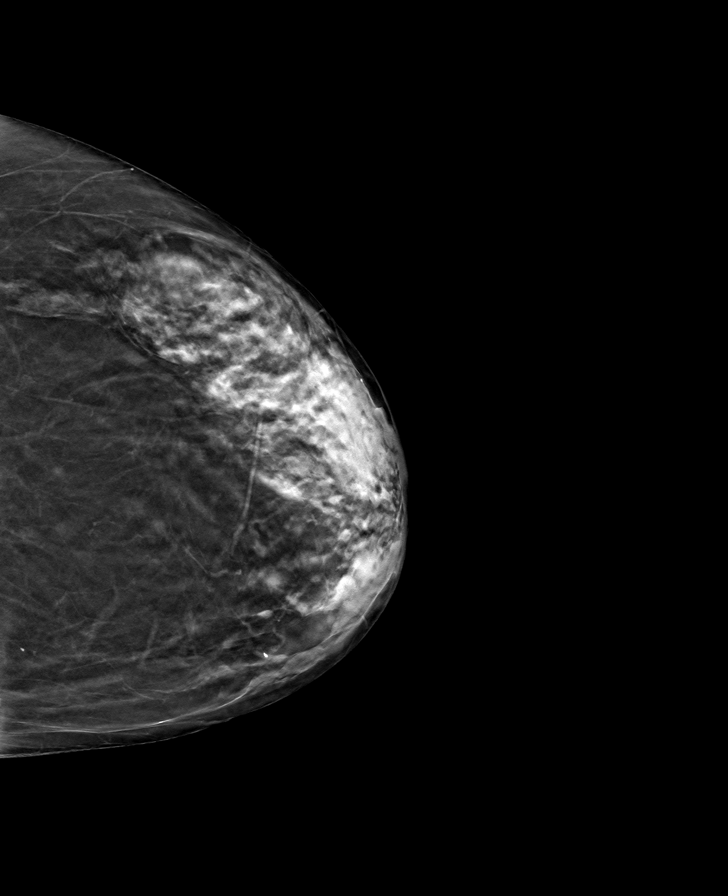

[R MLO tomo · tomo slice 29/58.0]
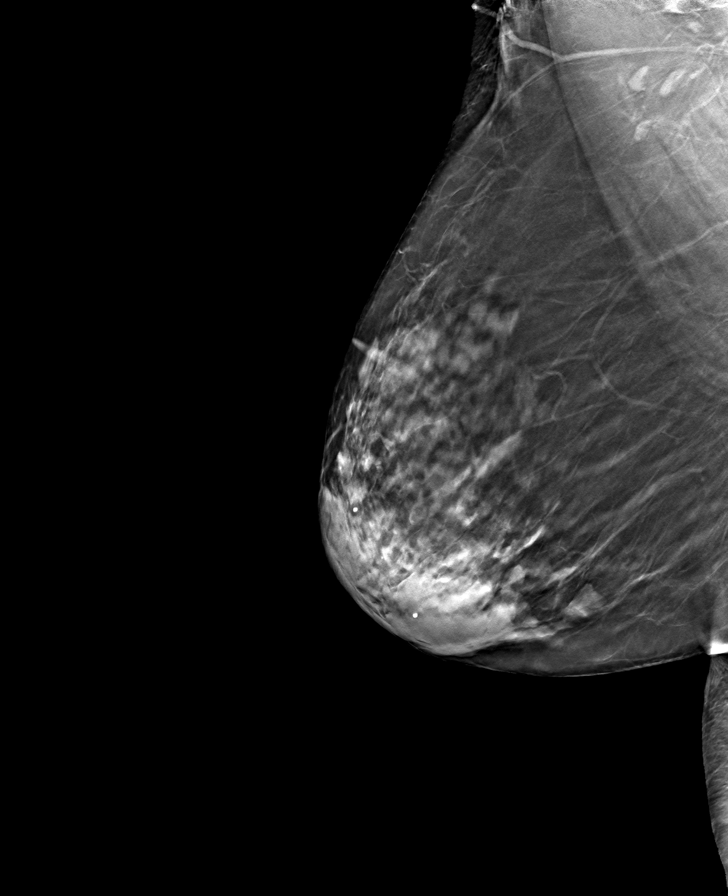

[8 of 24 positions shown; findings below may reference images not displayed]

ACR Breast Density Category c: The breast tissue is heterogeneously
dense, which may obscure small masses.
FINDINGS: There are no findings suspicious for malignancy. Images were
processed with CAD.
IMPRESSION: No mammographic evidence of malignancy. A result letter of this
screening mammogram will be mailed directly to the patient.

RECOMMENDATION:
Screening mammogram in one year. (Code:[id number])

BI-RADS CATEGORY  1: Negative.

## 2018-12-26 DIAGNOSIS — Z23 Encounter for immunization: Secondary | ICD-10-CM | POA: Diagnosis not present

## 2019-01-10 ENCOUNTER — Other Ambulatory Visit: Payer: Self-pay

## 2019-01-10 DIAGNOSIS — Z20822 Contact with and (suspected) exposure to covid-19: Secondary | ICD-10-CM

## 2019-01-12 LAB — NOVEL CORONAVIRUS, NAA: SARS-CoV-2, NAA: NOT DETECTED

## 2019-01-14 ENCOUNTER — Other Ambulatory Visit: Payer: Self-pay | Admitting: Internal Medicine

## 2019-01-14 DIAGNOSIS — Z1231 Encounter for screening mammogram for malignant neoplasm of breast: Secondary | ICD-10-CM

## 2019-01-14 DIAGNOSIS — M810 Age-related osteoporosis without current pathological fracture: Secondary | ICD-10-CM

## 2019-03-05 ENCOUNTER — Other Ambulatory Visit: Payer: Self-pay

## 2019-03-05 DIAGNOSIS — Z20822 Contact with and (suspected) exposure to covid-19: Secondary | ICD-10-CM

## 2019-03-08 LAB — NOVEL CORONAVIRUS, NAA: SARS-CoV-2, NAA: NOT DETECTED

## 2019-03-24 DIAGNOSIS — H25813 Combined forms of age-related cataract, bilateral: Secondary | ICD-10-CM | POA: Diagnosis not present

## 2019-03-24 DIAGNOSIS — H35372 Puckering of macula, left eye: Secondary | ICD-10-CM | POA: Diagnosis not present

## 2019-03-25 DIAGNOSIS — Z885 Allergy status to narcotic agent status: Secondary | ICD-10-CM | POA: Diagnosis not present

## 2019-03-25 DIAGNOSIS — Z87891 Personal history of nicotine dependence: Secondary | ICD-10-CM | POA: Diagnosis not present

## 2019-03-25 DIAGNOSIS — H269 Unspecified cataract: Secondary | ICD-10-CM | POA: Diagnosis not present

## 2019-03-25 DIAGNOSIS — R69 Illness, unspecified: Secondary | ICD-10-CM | POA: Diagnosis not present

## 2019-03-25 DIAGNOSIS — Z881 Allergy status to other antibiotic agents status: Secondary | ICD-10-CM | POA: Diagnosis not present

## 2019-03-25 DIAGNOSIS — F419 Anxiety disorder, unspecified: Secondary | ICD-10-CM | POA: Diagnosis not present

## 2019-04-08 ENCOUNTER — Ambulatory Visit
Admission: RE | Admit: 2019-04-08 | Discharge: 2019-04-08 | Disposition: A | Discharge status: Home / Self Care | Payer: Medicare HMO | Source: Ambulatory Visit | Attending: Internal Medicine | Admitting: Internal Medicine

## 2019-04-08 ENCOUNTER — Other Ambulatory Visit: Payer: Self-pay

## 2019-04-08 DIAGNOSIS — M810 Age-related osteoporosis without current pathological fracture: Secondary | ICD-10-CM | POA: Diagnosis not present

## 2019-04-08 DIAGNOSIS — Z1231 Encounter for screening mammogram for malignant neoplasm of breast: Secondary | ICD-10-CM

## 2019-04-08 DIAGNOSIS — Z780 Asymptomatic menopausal state: Secondary | ICD-10-CM | POA: Diagnosis not present

## 2019-04-08 DIAGNOSIS — M85852 Other specified disorders of bone density and structure, left thigh: Secondary | ICD-10-CM | POA: Diagnosis not present

## 2019-05-30 ENCOUNTER — Ambulatory Visit: Payer: Medicare HMO | Attending: Internal Medicine

## 2019-05-30 DIAGNOSIS — Z23 Encounter for immunization: Secondary | ICD-10-CM

## 2019-05-30 NOTE — Progress Notes (Signed)
   Covid-19 Vaccination Clinic  Name:  Valerie Hampton    MRN: [id number] DOB: 04/11/1940  05/30/2019  Valerie Hampton was observed post Covid-19 immunization for 30 minutes based on pre-vaccination screening without incidence. She was provided with Vaccine Information Sheet and instruction to access the V-Safe system.   Valerie Hampton was instructed to call 911 with any severe reactions post vaccine: Marland Kitchen Difficulty breathing  . Swelling of your face and throat  . A fast heartbeat  . A bad rash all over your body  . Dizziness and weakness    Immunizations Administered    Name Date Dose VIS Date Route   Pfizer COVID-19 Vaccine 05/30/2019  2:56 PM 0.3 mL 03/14/2019 Intramuscular   Manufacturer: [place]   Lot: [id number]   [place]: [id number]

## 2019-06-25 ENCOUNTER — Ambulatory Visit: Payer: Medicare HMO

## 2019-06-30 ENCOUNTER — Ambulatory Visit: Payer: Medicare HMO | Attending: Internal Medicine

## 2019-06-30 DIAGNOSIS — Z23 Encounter for immunization: Secondary | ICD-10-CM

## 2019-06-30 NOTE — Progress Notes (Signed)
   Covid-19 Vaccination Clinic  Name:  Valerie Hampton    MRN: [id number] DOB: 04/21/1940  06/30/2019  [name] was observed post Covid-19 immunization for 30 minutes based on pre-vaccination screening without incident. She was provided with Vaccine Information Sheet and instruction to access the V-Safe system.   [name] was instructed to call 911 with any severe reactions post vaccine: Marland Kitchen Difficulty breathing  . Swelling of face and throat  . A fast heartbeat  . A bad rash all over body  . Dizziness and weakness   Immunizations Administered    Name Date Dose VIS Date Route   Pfizer COVID-19 Vaccine 06/30/2019 11:18 AM 0.3 mL 03/14/2019 Intramuscular   Manufacturer: [place]   Lot: [id number]   [place]: [id number]

## 2019-07-18 DIAGNOSIS — J069 Acute upper respiratory infection, unspecified: Secondary | ICD-10-CM | POA: Diagnosis not present

## 2019-08-15 DIAGNOSIS — J069 Acute upper respiratory infection, unspecified: Secondary | ICD-10-CM | POA: Diagnosis not present

## 2019-08-15 DIAGNOSIS — R05 Cough: Secondary | ICD-10-CM | POA: Diagnosis not present

## 2019-09-23 DIAGNOSIS — R509 Fever, unspecified: Secondary | ICD-10-CM | POA: Diagnosis not present

## 2019-09-23 DIAGNOSIS — R05 Cough: Secondary | ICD-10-CM | POA: Diagnosis not present

## 2019-09-23 DIAGNOSIS — J069 Acute upper respiratory infection, unspecified: Secondary | ICD-10-CM | POA: Diagnosis not present

## 2019-09-24 ENCOUNTER — Other Ambulatory Visit: Payer: Medicare HMO

## 2019-11-14 DIAGNOSIS — M25521 Pain in right elbow: Secondary | ICD-10-CM | POA: Diagnosis not present

## 2019-11-14 DIAGNOSIS — M7711 Lateral epicondylitis, right elbow: Secondary | ICD-10-CM | POA: Diagnosis not present

## 2019-12-04 DIAGNOSIS — M25521 Pain in right elbow: Secondary | ICD-10-CM | POA: Diagnosis not present

## 2019-12-10 ENCOUNTER — Telehealth: Payer: Self-pay | Admitting: Internal Medicine

## 2019-12-10 ENCOUNTER — Encounter: Payer: Self-pay | Admitting: Internal Medicine

## 2019-12-10 NOTE — Telephone Encounter (Signed)
Hey Dr. Hilarie Fredrickson, this patient is a previous Dr. Olevia Perches patient. She is requesting you as her doctor because some people in her family are your patients. Please advise if you will accept patient. Thank you!

## 2019-12-10 NOTE — Telephone Encounter (Signed)
Patient scheduled for 11/05.

## 2019-12-10 NOTE — Telephone Encounter (Signed)
ok 

## 2019-12-30 ENCOUNTER — Encounter: Payer: Self-pay | Admitting: *Deleted

## 2020-01-14 DIAGNOSIS — Z23 Encounter for immunization: Secondary | ICD-10-CM | POA: Diagnosis not present

## 2020-01-31 ENCOUNTER — Ambulatory Visit: Payer: Medicare HMO | Attending: Internal Medicine

## 2020-01-31 DIAGNOSIS — Z23 Encounter for immunization: Secondary | ICD-10-CM

## 2020-01-31 NOTE — Progress Notes (Signed)
   Covid-19 Vaccination Clinic  Name:  Valerie Hampton    MRN: [id number] DOB: [birth year]  01/31/2020  [name] was observed post Covid-19 immunization for 15 minutes without incident. She was provided with Vaccine Information Sheet and instruction to access the V-Safe system.   [name] was instructed to call 911 with any severe reactions post vaccine: Marland Kitchen Difficulty breathing  . Swelling of face and throat  . A fast heartbeat  . A bad rash all over body  . Dizziness and weakness

## 2020-02-06 ENCOUNTER — Ambulatory Visit: Payer: Medicare HMO | Admitting: Internal Medicine

## 2020-02-06 ENCOUNTER — Encounter: Payer: Self-pay | Admitting: Internal Medicine

## 2020-02-06 VITALS — BP 126/60 | HR 76 | Ht <= 58 in | Wt 100.5 lb

## 2020-02-06 DIAGNOSIS — K224 Dyskinesia of esophagus: Secondary | ICD-10-CM

## 2020-02-06 DIAGNOSIS — R0789 Other chest pain: Secondary | ICD-10-CM

## 2020-02-06 DIAGNOSIS — R1310 Dysphagia, unspecified: Secondary | ICD-10-CM | POA: Diagnosis not present

## 2020-02-06 DIAGNOSIS — Z1211 Encounter for screening for malignant neoplasm of colon: Secondary | ICD-10-CM | POA: Diagnosis not present

## 2020-02-06 DIAGNOSIS — K219 Gastro-esophageal reflux disease without esophagitis: Secondary | ICD-10-CM | POA: Diagnosis not present

## 2020-02-06 MED ORDER — OMEPRAZOLE 40 MG PO CPDR: 40.0000 mg | DELAYED_RELEASE_CAPSULE | Freq: Every day | ORAL | 1 refills | Status: DC

## 2020-02-06 NOTE — Patient Instructions (Signed)
We have sent the following medications to your pharmacy for you to pick up at your convenience: Omeprazole 40 mg once daily x 2 months  You have been scheduled for an endoscopy. Please follow written instructions given to you at your visit today. If you use inhalers (even only as needed), please bring them with you on the day of your procedure.  If you are age 77 or older, your body mass index should be between 23-30. Your Body mass index is 22.73 kg/m. If this is out of the aforementioned range listed, please consider follow up with your Primary Care Provider.  Due to recent changes in healthcare laws, you may see the results of your imaging and laboratory studies on MyChart before your provider has had a chance to review them.  We understand that in some cases there may be results that are confusing or concerning to you. Not all laboratory results come back in the same time frame and the provider may be waiting for multiple results in order to interpret others.  Please give [place] 48 hours in order for your provider to thoroughly review all the results before contacting the office for clarification of your results.

## 2020-02-06 NOTE — Progress Notes (Signed)
Patient ID: Valerie Hampton, female   DOB: Nov 02, 1940, 79 y.o.   MRN: [id number] HPI: Valerie Hampton is a 79 year old female with a past medical history of GERD, esophageal dysmotility, constipation, history of anxiety and depression, osteoporosis who is here to evaluate trouble swallowing and atypical chest pain.  She is here alone today.  She is known to me because I help take care of her son, Oswald Hillock.  She has a history with Dr. Olevia Perches dating back greater than 10 years ago.  She had an EGD in October 2007 for reflux symptoms and dysphagia.  The exam was normal but a Maloney dilator 4 [place] was passed.  Pathology from esophageal biopsies showed scant fungal elements morphologically consistent with Candida. Colonoscopy was performed in October 2007 and normal She also previously had a barium esophagram which showed nonpropulsive peristalsis in 2008  She reports that she has had trouble with swallowing which has been mostly intermittent.  It seems to have been more troublesome in the last 2 months.  There are times with swallowing that she will feel that food transit slowly into the stomach.  Other times with swallowing she will have a discomfort and dull pain in her chest more than burning.  This again is intermittent and associated with swallowing but not exertion.  She also intermittently has issues with left upper quadrant pain under her left rib cage.  This comes and goes and has not been present in the last week.  She is used Tums with some success.  She does occasionally have a dry nonproductive cough.  Occasional heartburn.  Swallowing dysfunction seems to be worse with dry foods and also worse in the morning.  She is not having nausea or vomiting.  She will occasionally use omeprazole 20 mg for 14 days but is not taking this regularly nor recently.  She reports her bowel habits as mostly regular though she will use MiraLAX on occasion.  She is normally having bowel movement daily but can  skip a day from time to time.  Denies blood in stool or melena.  He recalls that her mother had colon cancer.  Her son had esophageal cancer.   Past Medical History:  Diagnosis Date  . Allergic rhinitis   . Anxiety   . Arthritis   . Candida esophagitis ([place])   . Cataract   . Chronic back pain   . Depression   . Family history of anesthesia complication    " sister went out ? "   . Fibromyalgia   . Gallstones   . GERD (gastroesophageal reflux disease)   . Headache(784.0)    due ot facial nerve condition   . Hiatal hernia   . Intraductal papilloma 2007   no evidence of malignancy  . Occipital neuralgia   . Osteopenia   . PONV (postoperative nausea and vomiting)     Past Surgical History:  Procedure Laterality Date  . ABDOMINAL HYSTERECTOMY    . BACK SURGERY    . bladder tack    . BREAST EXCISIONAL BIOPSY Bilateral    benign  . BREAST SURGERY Bilateral   . CERVICAL SPINE SURGERY    . CHOLECYSTECTOMY N/A 03/24/2015   Procedure: LAPAROSCOPIC CHOLECYSTECTOMY;  Surgeon: Aviva Signs, MD;  Location: [facility];  Service: General;  Laterality: N/A;  . DILATION AND CURETTAGE OF UTERUS    . EXCISIONAL HEMORRHOIDECTOMY    . LUMBAR LAMINECTOMY/DECOMPRESSION MICRODISCECTOMY N/A 09/09/2013   Procedure: LUMBAR LAMINECTOMY/DECOMPRESSION L4-5 MICRODISCECTOMY LEFT L4-5;  Surgeon: Jori Moll  Fransico Setters, MD;  Location: [facility];  Service: Orthopedics;  Laterality: N/A;  . NECK SURGERY    . TEMPORAL ARTERY BIOPSY / LIGATION Right   . TUBAL LIGATION      Outpatient Medications Prior to Visit  Medication Sig Dispense Refill  . acetaminophen (TYLENOL) 500 MG tablet Take 1,000 mg by mouth every 6 (six) hours as needed for fever.    Marland Kitchen alendronate (FOSAMAX) 70 MG tablet Take 70 mg by mouth once a week. Take with a full glass of water on an empty stomach.    . citalopram (CELEXA) 10 MG tablet Take 10 mg by mouth daily.    Marland Kitchen omeprazole (PRILOSEC OTC) 20 MG tablet Take 20 mg by mouth. Occasional as needed     . phenylephrine (SUDAFED PE) 10 MG TABS tablet Take 10 mg by mouth at bedtime.     . polyethylene glycol (MIRALAX / GLYCOLAX) packet Take 17 g by mouth daily.     Marland Kitchen triamcinolone cream (KENALOG) 0.5 % Apply 1 application topically 3 (three) times daily.    Marland Kitchen ALPRAZolam (XANAX) 0.25 MG tablet Take 0.25 mg by mouth 2 (two) times daily as needed for anxiety.     . Chlorpheniramine-DM (CORICIDIN HBP COUGH/COLD PO) Take 1 capsule by mouth every 4 (four) hours as needed (cold).    Marland Kitchen docusate sodium (COLACE) 100 MG capsule Take 100 mg by mouth 2 (two) times daily.     Marland Kitchen HYDROcodone-acetaminophen (NORCO/VICODIN) 5-325 MG tablet Take 1 tablet by mouth every 6 (six) hours as needed for moderate pain.    . hydrOXYzine (ATARAX/VISTARIL) 25 MG tablet Take 1 tablet (25 mg total) by mouth every 6 (six) hours as needed for itching. 12 tablet 0  . magnesium hydroxide (MILK OF MAGNESIA) 400 MG/5ML suspension Take 30 mLs by mouth daily as needed for mild constipation.     . methocarbamol (ROBAXIN) 500 MG tablet Take 1 tablet (500 mg total) by mouth every 6 (six) hours as needed for muscle spasms. 40 tablet 1  . omeprazole (PRILOSEC) 20 MG capsule Take 1 po BID x 2 weeks then once a day (Patient not taking: Reported on 03/23/2015) 60 capsule 0  . omeprazole (PRILOSEC) 40 MG capsule Take 1 capsule by mouth daily.    . ondansetron (ZOFRAN) 4 MG tablet Take 1 tablet (4 mg total) by mouth every 8 (eight) hours as needed. 6 tablet 0   No facility-administered medications prior to visit.    Allergies  Allergen Reactions  . Baclofen Other (See Comments)    Reaction unknown  . Contrast Media [Iodinated Diagnostic Agents]     rash  . Latex Other (See Comments)    Reaction unknown  . Morphine And Related Nausea And Vomiting  . Neosporin [Neomycin-Bacitracin Zn-Polymyx]     Rash, blisters   . Neosporin [Neomycin-Polymyxin-Gramicidin] Other (See Comments)    Blistering  . Nsaids Other (See Comments)    Agitates  stomach due to acid reflux  . Robaxin [Methocarbamol]   . Adhesive [Tape] Rash  . Buspar [Buspirone] Anxiety  . Carbamazepine Anxiety  . Ciprofloxacin Rash  . Iohexol Rash  . Oxycodone Rash  . Relafen [Nabumetone] Anxiety  . Zoloft [Sertraline Hcl] Anxiety    Family History  Problem Relation Age of Onset  . Cancer - Colon Mother   . Breast cancer Mother   . Alzheimer's disease Mother   . Diabetes Father   . Breast cancer Daughter   . Colon polyps Sister   .  Heart disease Sister   . Breast cancer Sister   . Heart disease Brother   . Stomach cancer Brother   . Esophageal cancer Son   . Ovarian cancer Sister   . Heart disease Sister   . Scleroderma Sister   . Heart disease Sister   . Heart disease Sister   . Hypertension Sister   . Dementia Sister     Social History   Tobacco Use  . Smoking status: Former Smoker    Types: Cigarettes    Quit date: 02/05/1970    Years since quitting: 50.0  . Smokeless tobacco: Never Used  Vaping Use  . Vaping Use: Never used  Substance Use Topics  . Alcohol use: No  . Drug use: No    ROS: As per history of present illness, otherwise negative  BP 126/60 (BP Location: Left Arm, Patient Position: Sitting, Cuff Size: Normal)   Pulse 76   Ht 4' 7.75" (1.416 m) Comment: height measured without shoes  Wt 100 lb 8 oz (45.6 kg)   BMI 22.73 kg/m  Gen: awake, alert, NAD HEENT: anicteric, op clear CV: RRR, no mrg Pulm: CTA b/l Abd: soft, NT/ND, +BS throughout Ext: no c/c/e Neuro: nonfocal   ASSESSMENT/PLAN:  79 year old female with a past medical history of GERD, esophageal dysmotility, constipation, history of anxiety and depression, osteoporosis who is here to evaluate trouble swallowing and atypical chest pain.    1.  GERD/esophageal dysphagia/esophageal dysmotility/esophageal spasm --it is likely that she has uncontrolled GERD which is causing issues with intermittent esophageal spasm and likely worsening esophageal  dysmotility.  There may be an element of presbyesophagus and also cannot exclude esophagitis or esophageal stricture.  We discussed this today.  I have recommended that we begin PPI therapy which she has tolerated in the past and proceed with upper endoscopy.  We discussed the risk, benefits and alternatives and she is agreeable and wishes to proceed --Omeprazole 40 mg daily --EGD with possible dilation  2.  Colon cancer screening --her last colonoscopy was in 2007.  I recommended colonoscopy today particularly in light of her family history.  She would like to think about this more and not proceed with scheduling this today.  She would like to address the issues in #1 above before considering colonoscopy.      RX:YVOPFY, Jori Moll, Md [address] [place],  [place] [address]

## 2020-02-23 DIAGNOSIS — Z1389 Encounter for screening for other disorder: Secondary | ICD-10-CM | POA: Diagnosis not present

## 2020-02-23 DIAGNOSIS — Z0000 Encounter for general adult medical examination without abnormal findings: Secondary | ICD-10-CM | POA: Diagnosis not present

## 2020-02-23 DIAGNOSIS — Z5181 Encounter for therapeutic drug level monitoring: Secondary | ICD-10-CM | POA: Diagnosis not present

## 2020-02-23 DIAGNOSIS — M810 Age-related osteoporosis without current pathological fracture: Secondary | ICD-10-CM | POA: Diagnosis not present

## 2020-02-23 DIAGNOSIS — R413 Other amnesia: Secondary | ICD-10-CM | POA: Diagnosis not present

## 2020-02-23 DIAGNOSIS — R69 Illness, unspecified: Secondary | ICD-10-CM | POA: Diagnosis not present

## 2020-03-08 ENCOUNTER — Telehealth: Payer: Self-pay | Admitting: Internal Medicine

## 2020-03-08 NOTE — Telephone Encounter (Signed)
Pt is requesting a call back from a nurse to discuss possibly having a colonoscopy along with her EGD if possible.

## 2020-03-08 NOTE — Telephone Encounter (Signed)
Left message for pt to call back. Colon cannot be done that day as there is not enough time and she would not have been able to do the dietary restrictions for 5 days prior.   Spoke with pt and she is aware and will keep the EGD as scheduled and set up the colon for a different date.

## 2020-03-11 ENCOUNTER — Ambulatory Visit (AMBULATORY_SURGERY_CENTER): Payer: Medicare HMO | Admitting: Internal Medicine

## 2020-03-11 ENCOUNTER — Other Ambulatory Visit: Payer: Self-pay

## 2020-03-11 ENCOUNTER — Encounter: Payer: Self-pay | Admitting: Internal Medicine

## 2020-03-11 VITALS — BP 130/74 | HR 88 | Temp 97.7°F | Resp 14 | Ht <= 58 in | Wt 100.0 lb

## 2020-03-11 DIAGNOSIS — K222 Esophageal obstruction: Secondary | ICD-10-CM

## 2020-03-11 DIAGNOSIS — R1310 Dysphagia, unspecified: Secondary | ICD-10-CM

## 2020-03-11 DIAGNOSIS — K449 Diaphragmatic hernia without obstruction or gangrene: Secondary | ICD-10-CM

## 2020-03-11 DIAGNOSIS — I10 Essential (primary) hypertension: Secondary | ICD-10-CM | POA: Diagnosis not present

## 2020-03-11 DIAGNOSIS — K219 Gastro-esophageal reflux disease without esophagitis: Secondary | ICD-10-CM

## 2020-03-11 DIAGNOSIS — R059 Cough, unspecified: Secondary | ICD-10-CM | POA: Diagnosis not present

## 2020-03-11 MED ORDER — SODIUM CHLORIDE 0.9 % IV SOLN: 500.0000 mL | Freq: Once | INTRAVENOUS | Status: DC

## 2020-03-11 NOTE — Progress Notes (Signed)
Called to room to assist during endoscopic procedure.  Patient ID and intended procedure confirmed with present staff. Received instructions for my participation in the procedure from the performing physician.  

## 2020-03-11 NOTE — Patient Instructions (Signed)
Please read handouts provided. Continue present medications. Continue omeprazole daily. Resume previous diet. Screening colonoscopy can be scheduled.     YOU HAD AN ENDOSCOPIC PROCEDURE TODAY AT [facility]:   Refer to the procedure report that was given to you for any specific questions about what was found during the examination.  If the procedure report does not answer your questions, please call your gastroenterologist to clarify.  If you requested that your care partner not be given the details of your procedure findings, then the procedure report has been included in a sealed envelope for you to review at your convenience later.  YOU SHOULD EXPECT: Some feelings of bloating in the abdomen. Passage of more gas than usual.  Walking can help get rid of the air that was put into your GI tract during the procedure and reduce the bloating. If you had a lower endoscopy (such as a colonoscopy or flexible sigmoidoscopy) you may notice spotting of blood in your stool or on the toilet paper. If you underwent a bowel prep for your procedure, you may not have a normal bowel movement for a few days.  Please Note:  You might notice some irritation and congestion in your nose or some drainage.  This is from the oxygen used during your procedure.  There is no need for concern and it should clear up in a day or so.  SYMPTOMS TO REPORT IMMEDIATELY:    Following upper endoscopy (EGD)  Vomiting of blood or coffee ground material  New chest pain or pain under the shoulder blades  Painful or persistently difficult swallowing  New shortness of breath  Fever of 100F or higher  Black, tarry-looking stools  For urgent or emergent issues, a gastroenterologist can be reached at any hour by calling [phone]. Do not use MyChart messaging for urgent concerns.    DIET:  We do recommend a small meal at first, but then you may proceed to your regular diet.  Drink plenty of fluids but you  should avoid alcoholic beverages for 24 hours.  ACTIVITY:  You should plan to take it easy for the rest of today and you should NOT DRIVE or use heavy machinery until tomorrow (because of the sedation medicines used during the test).    FOLLOW UP: Our staff will call the number listed on your records 48-72 hours following your procedure to check on you and address any questions or concerns that you may have regarding the information given to you following your procedure. If we do not reach you, we will leave a message.  We will attempt to reach you two times.  During this call, we will ask if you have developed any symptoms of COVID 19. If you develop any symptoms (ie: fever, flu-like symptoms, shortness of breath, cough etc.) before then, please call [phone].  If you test positive for Covid 19 in the 2 weeks post procedure, please call and report this information to [place].    If any biopsies were taken you will be contacted by phone or by letter within the next 1-3 weeks.  Please call us at [phone] if you have not heard about the biopsies in 3 weeks.    SIGNATURES/CONFIDENTIALITY: You and/or your care partner have signed paperwork which will be entered into your electronic medical record.  These signatures attest to the fact that that the information above on your After Visit Summary has been reviewed and is understood.  Full responsibility of the confidentiality of  this discharge information lies with you and/or your care-partner.

## 2020-03-11 NOTE — Progress Notes (Signed)
C.W. vital signs. 

## 2020-03-11 NOTE — Progress Notes (Signed)
Pt's states no medical or surgical changes since previsit or office visit. 

## 2020-03-11 NOTE — Progress Notes (Signed)
Report given to PACU, vss 

## 2020-03-11 NOTE — Op Note (Signed)
[place] Patient Name: Valerie Hampton Procedure Date: 03/11/2020 3:39 PM MRN: [id number] Endoscopist: Jerene Bears , MD Age: 79 Referring MD:  Date of Birth: 26-Aug-1940 Gender: Female Account #: [id number] Procedure:                Upper GI endoscopy Indications:              Dysphagia, Gastro-esophageal reflux disease,                            omeprazole started Feb 06, 2020, history of                            esophageal dysmotility Medicines:                Monitored Anesthesia Care Procedure:                Pre-Anesthesia Assessment:                           - Prior to the procedure, a History and Physical                            was performed, and patient medications and                            allergies were reviewed. The patient's tolerance of                            previous anesthesia was also reviewed. The risks                            and benefits of the procedure and the sedation                            options and risks were discussed with the patient.                            All questions were answered, and informed consent                            was obtained. Prior Anticoagulants: The patient has                            taken no previous anticoagulant or antiplatelet                            agents. ASA Grade Assessment: II - A patient with                            mild systemic disease. After reviewing the risks                            and benefits, the patient was deemed in  satisfactory condition to undergo the procedure.                           After obtaining informed consent, the endoscope was                            passed under direct vision. Throughout the                            procedure, the patient's blood pressure, pulse, and                            oxygen saturations were monitored continuously. The                            Endoscope was introduced through the mouth,  and                            advanced to the second part of duodenum. The upper                            GI endoscopy was accomplished without difficulty.                            The patient tolerated the procedure well. Scope In: Scope Out: Findings:                 A mild Schatzki ring was found at the                            gastroesophageal junction. A TTS dilator was passed                            through the scope. Dilation in the distal esophagus                            and across the GEJ with a 16-17-18 mm balloon                            dilator was performed to 18 mm.                           A 2 cm hiatal hernia was present.                           The entire examined stomach was normal.                           The examined duodenum was normal. Complications:            No immediate complications. Estimated Blood Loss:     Estimated blood loss was minimal. Impression:               - Mild Schatzki ring. Dilated to 18 mm with balloon.                           -  2 cm hiatal hernia.                           - Normal stomach.                           - Normal examined duodenum.                           - No specimens collected. Recommendation:           - Patient has a contact number available for                            emergencies. The signs and symptoms of potential                            delayed complications were discussed with the                            patient. Return to normal activities tomorrow.                            Written discharge instructions were provided to the                            patient.                           - Resume previous diet.                           - Continue present medications. Given improvement                            in symptoms continue the omeprazole daily.                           - Screening colonoscopy can be scheduled. Jerene Bears, MD 03/11/2020 3:57:36 PM This report has been  signed electronically.

## 2020-03-11 NOTE — Progress Notes (Signed)
1538 Robinul 0.1 mg IV given due large amount of secretions upon assessment.  MD made aware, vss 

## 2020-03-15 ENCOUNTER — Telehealth: Payer: Self-pay | Admitting: *Deleted

## 2020-03-15 NOTE — Telephone Encounter (Signed)
1. Have you developed a fever since your procedure? no  2.   Have you had an respiratory symptoms (SOB or cough) since your procedure? no  3.   Have you tested positive for COVID 19 since your procedure no  4.   Have you had any family members/close contacts diagnosed with the COVID 19 since your procedure?  no   If yes to any of these questions please route to Joylene John, RN and Joella Prince, RN Follow up Call-  Call back number 03/11/2020  Post procedure Call Back phone  # [phone]  Permission to leave phone message Yes  Some recent data might be hidden     Patient questions:  Do you have a fever, pain , or abdominal swelling? No. Pain Score  0 *  Have you tolerated food without any problems? Yes.    Have you been able to return to your normal activities? Yes.    Do you have any questions about your discharge instructions: Diet   No. Medications  No. Follow up visit  No.  Do you have questions or concerns about your Care? No.  Actions: * If pain score is 4 or above: No action needed, pain <4.

## 2020-03-31 ENCOUNTER — Other Ambulatory Visit: Payer: Self-pay | Admitting: Internal Medicine

## 2020-04-13 ENCOUNTER — Other Ambulatory Visit: Payer: Self-pay | Admitting: Internal Medicine

## 2020-12-01 DIAGNOSIS — H1033 Unspecified acute conjunctivitis, bilateral: Secondary | ICD-10-CM | POA: Diagnosis not present

## 2021-01-31 ENCOUNTER — Other Ambulatory Visit: Payer: Self-pay | Admitting: Internal Medicine

## 2021-02-21 DIAGNOSIS — J069 Acute upper respiratory infection, unspecified: Secondary | ICD-10-CM | POA: Diagnosis not present

## 2021-03-01 DIAGNOSIS — M810 Age-related osteoporosis without current pathological fracture: Secondary | ICD-10-CM | POA: Diagnosis not present

## 2021-03-01 DIAGNOSIS — Z0000 Encounter for general adult medical examination without abnormal findings: Secondary | ICD-10-CM | POA: Diagnosis not present

## 2021-03-01 DIAGNOSIS — F419 Anxiety disorder, unspecified: Secondary | ICD-10-CM | POA: Diagnosis not present

## 2021-03-01 DIAGNOSIS — R69 Illness, unspecified: Secondary | ICD-10-CM | POA: Diagnosis not present

## 2021-03-01 DIAGNOSIS — Z1389 Encounter for screening for other disorder: Secondary | ICD-10-CM | POA: Diagnosis not present

## 2021-03-01 DIAGNOSIS — Z79899 Other long term (current) drug therapy: Secondary | ICD-10-CM | POA: Diagnosis not present

## 2021-03-01 DIAGNOSIS — F32A Depression, unspecified: Secondary | ICD-10-CM | POA: Diagnosis not present

## 2021-03-07 ENCOUNTER — Other Ambulatory Visit: Payer: Self-pay | Admitting: Internal Medicine

## 2021-03-07 DIAGNOSIS — Z1231 Encounter for screening mammogram for malignant neoplasm of breast: Secondary | ICD-10-CM

## 2021-04-11 ENCOUNTER — Ambulatory Visit
Admission: RE | Admit: 2021-04-11 | Discharge: 2021-04-11 | Disposition: A | Discharge status: Home / Self Care | Payer: Medicare HMO | Source: Ambulatory Visit | Attending: Internal Medicine | Admitting: Internal Medicine

## 2021-04-11 DIAGNOSIS — Z1231 Encounter for screening mammogram for malignant neoplasm of breast: Secondary | ICD-10-CM

## 2021-05-03 DIAGNOSIS — M545 Low back pain, unspecified: Secondary | ICD-10-CM | POA: Diagnosis not present

## 2021-05-03 DIAGNOSIS — M479 Spondylosis, unspecified: Secondary | ICD-10-CM | POA: Diagnosis not present

## 2021-05-03 DIAGNOSIS — M25552 Pain in left hip: Secondary | ICD-10-CM | POA: Diagnosis not present

## 2021-05-03 DIAGNOSIS — M5136 Other intervertebral disc degeneration, lumbar region: Secondary | ICD-10-CM | POA: Diagnosis not present

## 2021-05-09 DIAGNOSIS — Z01818 Encounter for other preprocedural examination: Secondary | ICD-10-CM | POA: Diagnosis not present

## 2021-05-09 DIAGNOSIS — H25811 Combined forms of age-related cataract, right eye: Secondary | ICD-10-CM | POA: Diagnosis not present

## 2021-05-09 DIAGNOSIS — H25812 Combined forms of age-related cataract, left eye: Secondary | ICD-10-CM | POA: Diagnosis not present

## 2021-05-09 DIAGNOSIS — H35372 Puckering of macula, left eye: Secondary | ICD-10-CM | POA: Diagnosis not present

## 2021-05-17 DIAGNOSIS — N9089 Other specified noninflammatory disorders of vulva and perineum: Secondary | ICD-10-CM | POA: Diagnosis not present

## 2021-05-26 DIAGNOSIS — H25811 Combined forms of age-related cataract, right eye: Secondary | ICD-10-CM | POA: Diagnosis not present

## 2021-06-09 DIAGNOSIS — H25812 Combined forms of age-related cataract, left eye: Secondary | ICD-10-CM | POA: Diagnosis not present

## 2021-07-07 DIAGNOSIS — Z0100 Encounter for examination of eyes and vision without abnormal findings: Secondary | ICD-10-CM | POA: Diagnosis not present

## 2021-08-03 DIAGNOSIS — H35352 Cystoid macular degeneration, left eye: Secondary | ICD-10-CM | POA: Diagnosis not present

## 2021-08-03 DIAGNOSIS — H35372 Puckering of macula, left eye: Secondary | ICD-10-CM | POA: Diagnosis not present

## 2021-08-12 ENCOUNTER — Other Ambulatory Visit: Payer: Self-pay | Admitting: Internal Medicine

## 2021-09-02 DIAGNOSIS — H35352 Cystoid macular degeneration, left eye: Secondary | ICD-10-CM | POA: Diagnosis not present

## 2021-09-02 DIAGNOSIS — H35372 Puckering of macula, left eye: Secondary | ICD-10-CM | POA: Diagnosis not present

## 2021-10-17 DIAGNOSIS — H26493 Other secondary cataract, bilateral: Secondary | ICD-10-CM | POA: Diagnosis not present

## 2021-11-11 DIAGNOSIS — H35352 Cystoid macular degeneration, left eye: Secondary | ICD-10-CM | POA: Diagnosis not present

## 2021-11-11 DIAGNOSIS — H35372 Puckering of macula, left eye: Secondary | ICD-10-CM | POA: Diagnosis not present

## 2021-11-11 DIAGNOSIS — H04203 Unspecified epiphora, bilateral lacrimal glands: Secondary | ICD-10-CM | POA: Diagnosis not present

## 2021-11-16 ENCOUNTER — Other Ambulatory Visit: Payer: Self-pay | Admitting: Internal Medicine

## 2021-12-27 DIAGNOSIS — M25551 Pain in right hip: Secondary | ICD-10-CM | POA: Diagnosis not present

## 2021-12-27 DIAGNOSIS — M545 Low back pain, unspecified: Secondary | ICD-10-CM | POA: Diagnosis not present

## 2021-12-27 DIAGNOSIS — M47896 Other spondylosis, lumbar region: Secondary | ICD-10-CM | POA: Diagnosis not present

## 2021-12-29 ENCOUNTER — Other Ambulatory Visit: Payer: Self-pay | Admitting: Internal Medicine

## 2021-12-29 DIAGNOSIS — Z1231 Encounter for screening mammogram for malignant neoplasm of breast: Secondary | ICD-10-CM

## 2021-12-29 DIAGNOSIS — M810 Age-related osteoporosis without current pathological fracture: Secondary | ICD-10-CM

## 2022-01-02 DIAGNOSIS — M545 Low back pain, unspecified: Secondary | ICD-10-CM | POA: Diagnosis not present

## 2022-01-11 DIAGNOSIS — M25551 Pain in right hip: Secondary | ICD-10-CM | POA: Diagnosis not present

## 2022-01-16 ENCOUNTER — Ambulatory Visit: Payer: Medicare HMO | Admitting: Physician Assistant

## 2022-01-16 ENCOUNTER — Encounter: Payer: Self-pay | Admitting: Physician Assistant

## 2022-01-16 VITALS — BP 136/64 | HR 85 | Ht <= 58 in | Wt 98.4 lb

## 2022-01-16 DIAGNOSIS — Z800 Family history of malignant neoplasm of digestive organs: Secondary | ICD-10-CM | POA: Diagnosis not present

## 2022-01-16 DIAGNOSIS — K219 Gastro-esophageal reflux disease without esophagitis: Secondary | ICD-10-CM

## 2022-01-16 MED ORDER — OMEPRAZOLE 40 MG PO CPDR: 40.0000 mg | DELAYED_RELEASE_CAPSULE | Freq: Every day | ORAL | 3 refills | Status: DC

## 2022-01-16 NOTE — Progress Notes (Signed)
Chief Complaint: Refill of Omeprazole and discuss colonoscopy  HPI:    Valerie Hampton is a 81 year old Caucasian female with a past medical history as listed below including GERD, esophageal dysmotility, constipation, anxiety and depression and osteoporosis, known to Dr. Hilarie Fredrickson, who was referred to me by Seward Carol, MD for a refill of Omeprazole and discuss colonoscopy.      02/06/2020 patient seen in clinic by Dr. Hilarie Fredrickson for trouble swallowing and atypical chest pain.  At that time noted she had history of Dr. Maurene Capes dating back greater than 10 years ago with an EGD in October 2007 for reflux and dysphagia which was normal but a [place] dilator was passed.  Biopsy showed scant fungal elements morphologically consistent with Candida.  Colonoscopy in October 2007 normal.  Barium esophagram in 2008 showed nonpropulsive peristalsis.  At that time was thought her uncontrolled reflux was causing intermittent esophageal spasm and worsening esophageal dysmotility.  She was started on Omeprazole 40 mg daily and discussed EGD with possible dilation as well as a colonoscopy.    03/11/2020 EGD with mild Schatzki's ring dilated to 18 mm and 2 cm hiatal hernia.  Otherwise normal.  Patient recommended to schedule screening colonoscopy.      Today, the patient tells me that she ran out of her Omeprazole 40 mg daily over the past couple of weeks and started having a problem.  She has been taking over-the-counter Tums instead which does not work as well.  Also tells me that she has had a lot of social problems including deaths of family members and her husband has been unable to schedule her colonoscopy but would like to get this done now.    Does describe some chronic constipation issues off and on for which she uses MiraLAX as needed.  Tells me that when she has problems it feels like something just gets stuck at the bottom and when she is able to get that moved out of the way then she has a bowel movement.  Apparently  has been told she may have a rectocele or cystocele in the past.    Denies fever, chills, weight loss, blood in her stool or symptoms that awaken her from sleep.  Past Medical History:  Diagnosis Date   Allergic rhinitis    Anxiety    Arthritis    Candida esophagitis (HCC)    Cataract    Chronic back pain    Depression    Family history of anesthesia complication    " sister went out ? "    Fibromyalgia    Gallstones    GERD (gastroesophageal reflux disease)    Headache(784.0)    due ot facial nerve condition    Hiatal hernia    Intraductal papilloma 2007   no evidence of malignancy   Occipital neuralgia    Osteopenia    PONV (postoperative nausea and vomiting)     Past Surgical History:  Procedure Laterality Date   ABDOMINAL HYSTERECTOMY     BACK SURGERY     bladder tack     BREAST EXCISIONAL BIOPSY Bilateral    benign   BREAST SURGERY Bilateral    CERVICAL SPINE SURGERY     CHOLECYSTECTOMY N/A 03/24/2015   Procedure: LAPAROSCOPIC CHOLECYSTECTOMY;  Surgeon: Aviva Signs, MD;  Location: [facility];  Service: General;  Laterality: N/A;   DILATION AND CURETTAGE OF UTERUS     EXCISIONAL HEMORRHOIDECTOMY     LUMBAR LAMINECTOMY/DECOMPRESSION MICRODISCECTOMY N/A 09/09/2013   Procedure: LUMBAR LAMINECTOMY/DECOMPRESSION L4-5  MICRODISCECTOMY LEFT L4-5;  Surgeon: Tobi Bastos, MD;  Location: [facility];  Service: Orthopedics;  Laterality: N/A;   NECK SURGERY     TEMPORAL ARTERY BIOPSY / LIGATION Right    TUBAL LIGATION      Current Outpatient Medications  Medication Sig Dispense Refill   acetaminophen (TYLENOL) 500 MG tablet Take 1,000 mg by mouth every 6 (six) hours as needed for fever.     ALPRAZolam (XANAX) 0.25 MG tablet Take 0.25 mg by mouth daily as needed.     citalopram (CELEXA) 10 MG tablet Take 10 mg by mouth daily.     omeprazole (PRILOSEC) 40 MG capsule Take 1 capsule by mouth once daily 45 capsule 0   polyethylene glycol (MIRALAX / GLYCOLAX) packet Take 17 g by mouth  daily.     triamcinolone cream (KENALOG) 0.5 % Apply 1 application topically 3 (three) times daily.     alendronate (FOSAMAX) 70 MG tablet Take 70 mg by mouth once a week. Take with a full glass of water on an empty stomach.     dextromethorphan (DELSYM) 30 MG/5ML liquid 10 ml as needed     ibuprofen (ADVIL) 200 MG tablet 1 tablet with food or milk as needed     phenylephrine (SUDAFED PE) 10 MG TABS tablet Take 10 mg by mouth at bedtime.  (Patient not taking: Reported on 03/11/2020)     No current facility-administered medications for this visit.    Allergies as of 01/16/2022 - Review Complete 01/16/2022  Allergen Reaction Noted   Baclofen Other (See Comments) 03/10/2013   Contrast media [iodinated contrast media]  03/23/2015   Latex Other (See Comments) 09/09/2013   Morphine and related Nausea And Vomiting 09/05/2013   Neosporin [neomycin-bacitracin zn-polymyx]  03/23/2015   Neosporin [neomycin-polymyxin-gramicidin] Other (See Comments) 03/23/2015   Nsaids Other (See Comments)    Robaxin [methocarbamol]  03/24/2015   Adhesive [tape] Rash 09/09/2013   Buspar [buspirone] Anxiety 03/23/2015   Carbamazepine Anxiety 03/23/2015   Ciprofloxacin Rash 10/10/2007   Iohexol Rash 12/06/2010   Oxycodone Rash 03/23/2015   Relafen [nabumetone] Anxiety 03/23/2015   Zoloft [sertraline hcl] Anxiety 03/23/2015    Family History  Problem Relation Age of Onset   Cancer - Colon Mother    Breast cancer Mother    Alzheimer's disease Mother    Diabetes Father    Breast cancer Daughter    Colon polyps Sister    Heart disease Sister    Breast cancer Sister    Heart disease Brother    Stomach cancer Brother    Esophageal cancer Son    Ovarian cancer Sister    Heart disease Sister    Scleroderma Sister    Heart disease Sister    Heart disease Sister    Hypertension Sister    Dementia Sister     Social History   Socioeconomic History   Marital status: Married    Spouse name: Sonia Side     Number of children: 4   Years of education: 8 th   Highest education level: Not on file  Occupational History   Occupation: retired    Comment: retired  Tobacco Use   Smoking status: Former    Types: Cigarettes    Quit date: 02/05/1970    Years since quitting: 51.9   Smokeless tobacco: Never  Vaping Use   Vaping Use: Never used  Substance and Sexual Activity   Alcohol use: No   Drug use: No   Sexual activity: Not on  file  Other Topics Concern   Not on file  Social History Narrative   Patient lives at home with her husband Sonia Side)   Retired.   Education -8 th   Right handed.   Caffeine- None   Social Determinants of Health   Financial Resource Strain: Not on file  Food Insecurity: Not on file  Transportation Needs: Not on file  Physical Activity: Not on file  Stress: Not on file  Social Connections: Not on file  Intimate Partner Violence: Not on file    Review of Systems:    Constitutional: No weight loss, fever or chills Cardiovascular: No chest pain Respiratory: No SOB Gastrointestinal: See HPI and otherwise negative   Physical Exam:  Vital signs: BP 136/64   Pulse 85   Ht '4\' 9"'$  (1.448 m)   Wt 98 lb 6 oz (44.6 kg)   BMI 21.29 kg/m   Constitutional:   Very Pleasant Elderly Caucasian female appears to be in NAD, Well developed, Well nourished, alert and cooperative Respiratory: Respirations even and unlabored. Lungs clear to auscultation bilaterally.   No wheezes, crackles, or rhonchi.  Cardiovascular: Normal S1, S2. No MRG. Regular rate and rhythm. No peripheral edema, cyanosis or pallor.  Gastrointestinal:  Soft, nondistended, nontender. No rebound or guarding. Normal bowel sounds. No appreciable masses or hepatomegaly. Rectal:  Not performed.  Psychiatric: Oriented to person, place and time. Demonstrates good judgement and reason without abnormal affect or behaviors.  Recent labs with PCP.  Assessment: 1.  Constipation: Occasionally has trouble with  constipation relieved with MiraLAX, question cystocele or rectocele revealed diagnosis in the past 2.  GERD: Controlled on Omeprazole 40 daily 3.  Family history of colon cancer: In the patient's mother, last colonoscopy 2017  Plan: 1.  Scheduled patient for a screening colonoscopy in the [address] with Dr. Hilarie Fredrickson due to family history of colon cancer.  Did provide the patient a detailed list of risks for the procedure and she agrees to proceed. Patient is appropriate for endoscopic procedure(s) in the ambulatory ([facility]) setting.  2.  Refilled Omeprazole 40 mg daily, 30-60 minutes before breakfast #90 with 3 refills.  Patient can have refills for another year if she is doing well. 3.  Patient to follow in clinic per recommendations after colonoscopy above.  Ellouise Newer, PA-C [facility] 01/16/2022, 11:03 AM  Cc: Seward Carol, MD

## 2022-01-16 NOTE — Patient Instructions (Addendum)
_______________________________________________________  If you are age 79 or older, your body mass index should be between 23-30. Your Body mass index is 21.29 kg/m. If this is out of the aforementioned range listed, please consider follow up with your Primary Care Provider. ________________________________________________________  The [facility] providers would like to encourage you to use [facility] to communicate with providers for non-urgent requests or questions.  Due to long hold times on the telephone, sending your provider a message by [facility] may be a faster and more efficient way to get a response.  Please allow 48 business hours for a response.  Please remember that this is for non-urgent requests.  _______________________________________________________  Dennis Bast have been scheduled for a colonoscopy. Please follow written instructions given to you at your visit today.  Please pick up your prep supplies at the pharmacy within the next 1-3 days. If you use inhalers (even only as needed), please bring them with you on the day of your procedure.  Due to recent changes in healthcare laws, you may see the results of your imaging and laboratory studies on MyChart before your provider has had a chance to review them.  We understand that in some cases there may be results that are confusing or concerning to you. Not all laboratory results come back in the same time frame and the provider may be waiting for multiple results in order to interpret others.  Please give [place] 48 hours in order for your provider to thoroughly review all the results before contacting the office for clarification of your results.   We have sent the following medications to your pharmacy for you to pick up at your convenience:  CONTINUE: omeprazole '40mg'$  once daily  Thank you for entrusting me with your care and choosing [facility].  Ellouise Newer, PA-C

## 2022-01-17 DIAGNOSIS — M545 Low back pain, unspecified: Secondary | ICD-10-CM | POA: Diagnosis not present

## 2022-01-19 DIAGNOSIS — M545 Low back pain, unspecified: Secondary | ICD-10-CM | POA: Diagnosis not present

## 2022-01-19 NOTE — Progress Notes (Signed)
Addendum: Reviewed and agree with assessment and management plan. Armari Fussell M, MD  

## 2022-01-25 DIAGNOSIS — M545 Low back pain, unspecified: Secondary | ICD-10-CM | POA: Diagnosis not present

## 2022-01-31 DIAGNOSIS — M545 Low back pain, unspecified: Secondary | ICD-10-CM | POA: Diagnosis not present

## 2022-02-02 DIAGNOSIS — M545 Low back pain, unspecified: Secondary | ICD-10-CM | POA: Diagnosis not present

## 2022-02-07 DIAGNOSIS — M48061 Spinal stenosis, lumbar region without neurogenic claudication: Secondary | ICD-10-CM | POA: Diagnosis not present

## 2022-02-07 DIAGNOSIS — M5431 Sciatica, right side: Secondary | ICD-10-CM | POA: Diagnosis not present

## 2022-02-07 DIAGNOSIS — M25551 Pain in right hip: Secondary | ICD-10-CM | POA: Diagnosis not present

## 2022-02-20 ENCOUNTER — Telehealth: Payer: Self-pay | Admitting: Physician Assistant

## 2022-02-20 NOTE — Telephone Encounter (Signed)
Pt called and states she does not want to have the colon done 03/08/22, she wants to cancel the appt. Pt states she is 89 and will soon be 82 and she thinks she will feel better just not having it done. Appt cancelled and Dr. Hilarie Fredrickson aware.

## 2022-02-20 NOTE — Telephone Encounter (Signed)
Inbound call from patient requesting a call back to discuss her upcoming procedure for a colonoscopy on 12/6. Patient stated that she is concerned about having the procedure due to her age and is just wanting to discuss and she may cancel appointment. Please advise.

## 2022-02-21 DIAGNOSIS — M5416 Radiculopathy, lumbar region: Secondary | ICD-10-CM | POA: Diagnosis not present

## 2022-03-06 DIAGNOSIS — Z5181 Encounter for therapeutic drug level monitoring: Secondary | ICD-10-CM | POA: Diagnosis not present

## 2022-03-06 DIAGNOSIS — Z0000 Encounter for general adult medical examination without abnormal findings: Secondary | ICD-10-CM | POA: Diagnosis not present

## 2022-03-06 DIAGNOSIS — M810 Age-related osteoporosis without current pathological fracture: Secondary | ICD-10-CM | POA: Diagnosis not present

## 2022-03-06 DIAGNOSIS — R030 Elevated blood-pressure reading, without diagnosis of hypertension: Secondary | ICD-10-CM | POA: Diagnosis not present

## 2022-03-06 DIAGNOSIS — Z79899 Other long term (current) drug therapy: Secondary | ICD-10-CM | POA: Diagnosis not present

## 2022-03-06 DIAGNOSIS — M519 Unspecified thoracic, thoracolumbar and lumbosacral intervertebral disc disorder: Secondary | ICD-10-CM | POA: Diagnosis not present

## 2022-03-06 DIAGNOSIS — R69 Illness, unspecified: Secondary | ICD-10-CM | POA: Diagnosis not present

## 2022-03-06 DIAGNOSIS — Z1331 Encounter for screening for depression: Secondary | ICD-10-CM | POA: Diagnosis not present

## 2022-03-08 ENCOUNTER — Encounter: Payer: Medicare HMO | Admitting: Internal Medicine

## 2022-03-08 DIAGNOSIS — M5431 Sciatica, right side: Secondary | ICD-10-CM | POA: Diagnosis not present

## 2022-03-08 DIAGNOSIS — M545 Low back pain, unspecified: Secondary | ICD-10-CM | POA: Diagnosis not present

## 2022-03-08 DIAGNOSIS — M48061 Spinal stenosis, lumbar region without neurogenic claudication: Secondary | ICD-10-CM | POA: Diagnosis not present

## 2022-05-22 DIAGNOSIS — J069 Acute upper respiratory infection, unspecified: Secondary | ICD-10-CM | POA: Diagnosis not present

## 2022-05-22 DIAGNOSIS — Z03818 Encounter for observation for suspected exposure to other biological agents ruled out: Secondary | ICD-10-CM | POA: Diagnosis not present

## 2022-05-29 ENCOUNTER — Ambulatory Visit (INDEPENDENT_AMBULATORY_CARE_PROVIDER_SITE_OTHER): Payer: Medicare HMO

## 2022-05-29 ENCOUNTER — Ambulatory Visit
Admission: EM | Admit: 2022-05-29 | Discharge: 2022-05-29 | Disposition: A | Discharge status: Home / Self Care | Payer: Medicare HMO | Attending: Nurse Practitioner | Admitting: Nurse Practitioner

## 2022-05-29 DIAGNOSIS — R062 Wheezing: Secondary | ICD-10-CM

## 2022-05-29 DIAGNOSIS — R059 Cough, unspecified: Secondary | ICD-10-CM

## 2022-05-29 DIAGNOSIS — J069 Acute upper respiratory infection, unspecified: Secondary | ICD-10-CM

## 2022-05-29 MED ORDER — ALBUTEROL SULFATE (2.5 MG/3ML) 0.083% IN NEBU: 2.5000 mg | INHALATION_SOLUTION | Freq: Four times a day (QID) | RESPIRATORY_TRACT | 0 refills | Status: AC | PRN

## 2022-05-29 MED ORDER — PREDNISONE 20 MG PO TABS: 40.0000 mg | ORAL_TABLET | Freq: Every day | ORAL | 0 refills | Status: AC

## 2022-05-29 MED ORDER — METHYLPREDNISOLONE SODIUM SUCC 125 MG IJ SOLR
60.0000 mg | Freq: Once | INTRAMUSCULAR | Status: AC
  Administered 2022-05-29: 60 mg via INTRAMUSCULAR

## 2022-05-29 MED ORDER — BENZONATATE 100 MG PO CAPS: 100.0000 mg | ORAL_CAPSULE | Freq: Three times a day (TID) | ORAL | 0 refills | Status: AC | PRN

## 2022-05-29 MED ORDER — IPRATROPIUM BROMIDE 0.02 % IN SOLN
0.5000 mg | Freq: Once | RESPIRATORY_TRACT | Status: AC
  Administered 2022-05-29: 0.5 mg via RESPIRATORY_TRACT

## 2022-05-29 MED ORDER — ALBUTEROL SULFATE (2.5 MG/3ML) 0.083% IN NEBU
2.5000 mg | INHALATION_SOLUTION | Freq: Once | RESPIRATORY_TRACT | Status: AC
  Administered 2022-05-29: 2.5 mg via RESPIRATORY_TRACT

## 2022-05-29 NOTE — Discharge Instructions (Signed)
As we discussed, I suspect you had a viral upper respiratory infection that has now caused lung inflammation also known as wheezing in your chest.  The chest x-ray today does not show any pneumonia.  We have given you a breathing treatment today-continue to use a breathing treatment at home every 4-6 hours as needed for wheezing, shortness of breath, or coughing.  We have also given you a shot of steroid today that should help with the lung inflammation.  Start [employer] as needed for dry cough.  Starting tomorrow, you can start the prednisone daily in the morning to help with lung inflammation.  Seek care if symptoms persist or worsen despite treatment.

## 2022-05-29 NOTE — ED Triage Notes (Signed)
Pt reports cough and chest congestion x 2 weeks. Taking OTC meds.

## 2022-05-29 NOTE — ED Provider Notes (Signed)
[facility]    CSN: [id number] Arrival date & time: 05/29/22  0911      History   Chief Complaint Chief Complaint  Patient presents with   Cough    HPI Valerie Hampton is a 82 y.o. female.   Patient presents today with daughter for more 2 week history of cough.  Also having mild congestion, post nasal drainage, headache, ear pain, and fatigue.  Feels like occasionally she gets into a coughing fit and starts wheezing.  No nasal congestion, runny nose, post nasal drainage, sinus pressure, abdominal pain, nausea, vomiting, diarrhea, decreased appetite, loss of taste, loss of smell, rash, or known sick contacts.  Has taken Robitussin DM, Sudafed, equate cough medication however the cough returns after the medicine wears off.   Patient and daughter deny history of chronic lung disease.  They do report a history of bronchitis in the past.  Patient reports she quit smoking approximately 50 years ago.  Does not need inhalers on a regular basis and has never been prescribed an inhaler.  Reports that she was seen approximately 1 week ago by her primary care provider and tested negative for influenza, RSV, and COVID-19.    Past Medical History:  Diagnosis Date   Allergic rhinitis    Anxiety    Arthritis    Candida esophagitis (HCC)    Cataract    Chronic back pain    Depression    Family history of anesthesia complication    " sister went out ? "    Fibromyalgia    Gallstones    GERD (gastroesophageal reflux disease)    Headache(784.0)    due ot facial nerve condition    Hiatal hernia    Intraductal papilloma 2007   no evidence of malignancy   Occipital neuralgia    Osteopenia    PONV (postoperative nausea and vomiting)     Patient Active Problem List   Diagnosis Date Noted   Biliary colic [id number]   Cholelithiasis 03/23/2015   Acute calculous cholecystitis 03/23/2015   Spinal stenosis, lumbar region, with neurogenic claudication 09/09/2013   Herniated  lumbar intervertebral disc 09/09/2013   Spinal stenosis of lumbar region 09/06/2013   Right facial pain 03/11/2013   CARCINOMA IN SITU OF BREAST 10/10/2007   ESOPHAGEAL STRICTURE 10/10/2007   CONSTIPATION 10/10/2007   IRRITABLE BOWEL SYNDROME 10/10/2007   CERVICAL DISC DISORDER 10/10/2007   CANDIDIASIS, ESOPHAGEAL 06/13/2007   EROSIVE ESOPHAGITIS 06/13/2007   ESOPHAGEAL MOTILITY DISORDER 06/13/2007   GERD 06/13/2007   FIBROMYALGIA 06/13/2007    Past Surgical History:  Procedure Laterality Date   ABDOMINAL HYSTERECTOMY     BACK SURGERY     bladder tack     BREAST EXCISIONAL BIOPSY Bilateral    benign   BREAST SURGERY Bilateral    CERVICAL SPINE SURGERY     CHOLECYSTECTOMY N/A 03/24/2015   Procedure: LAPAROSCOPIC CHOLECYSTECTOMY;  Surgeon: Aviva Signs, MD;  Location: [facility];  Service: General;  Laterality: N/A;   DILATION AND CURETTAGE OF UTERUS     EXCISIONAL HEMORRHOIDECTOMY     LUMBAR LAMINECTOMY/DECOMPRESSION MICRODISCECTOMY N/A 09/09/2013   Procedure: LUMBAR LAMINECTOMY/DECOMPRESSION L4-5 MICRODISCECTOMY LEFT L4-5;  Surgeon: Tobi Bastos, MD;  Location: [facility];  Service: Orthopedics;  Laterality: N/A;   NECK SURGERY     TEMPORAL ARTERY BIOPSY / LIGATION Right    TUBAL LIGATION      OB History     Gravida  7   Para  4   Term  4   Preterm      AB  3   Living  3      SAB  3   IAB      Ectopic      Multiple      Live Births               Home Medications    Prior to Admission medications   Medication Sig Start Date End Date Taking? Authorizing Provider  albuterol (PROVENTIL) (2.5 MG/3ML) 0.083% nebulizer solution Take 3 mLs (2.5 mg total) by nebulization every 6 (six) hours as needed for wheezing or shortness of breath. 05/29/22  Yes Noemi Chapel A, NP  benzonatate (TESSALON) 100 MG capsule Take 1 capsule (100 mg total) by mouth 3 (three) times daily as needed for cough. Do not take with alcohol or while driving or operating heavy  machinery.  May cause drowsiness. 05/29/22  Yes Eulogio Bear, NP  predniSONE (DELTASONE) 20 MG tablet Take 2 tablets (40 mg total) by mouth daily with breakfast for 5 days. 05/29/22 06/03/22 Yes Eulogio Bear, NP  acetaminophen (TYLENOL) 500 MG tablet Take 1,000 mg by mouth every 6 (six) hours as needed for fever.    [provider]  alendronate (FOSAMAX) 70 MG tablet Take 70 mg by mouth once a week. Take with a full glass of water on an empty stomach.    [provider]  ALPRAZolam Duanne Moron) 0.25 MG tablet Take 0.25 mg by mouth daily as needed. 10/28/21   [provider]  citalopram (CELEXA) 10 MG tablet Take 10 mg by mouth daily.    [provider]  dextromethorphan (DELSYM) 30 MG/5ML liquid 10 ml as needed    [provider]  ibuprofen (ADVIL) 200 MG tablet 1 tablet with food or milk as needed    [provider]  omeprazole (PRILOSEC) 40 MG capsule Take 1 capsule (40 mg total) by mouth daily. 01/16/22   Levin Erp, PA  phenylephrine (SUDAFED PE) 10 MG TABS tablet Take 10 mg by mouth at bedtime.  Patient not taking: Reported on 03/11/2020    [provider]  polyethylene glycol (MIRALAX / GLYCOLAX) packet Take 17 g by mouth daily.    [provider]  triamcinolone cream (KENALOG) 0.5 % Apply 1 application topically 3 (three) times daily. 03/22/15   [provider]    Family History Family History  Problem Relation Age of Onset   Cancer - Colon Mother    Breast cancer Mother    Alzheimer's disease Mother    Diabetes Father    Breast cancer Daughter    Colon polyps Sister    Heart disease Sister    Breast cancer Sister    Heart disease Brother    Stomach cancer Brother    Esophageal cancer Son    Ovarian cancer Sister    Heart disease Sister    Scleroderma Sister    Heart disease Sister    Heart disease Sister    Hypertension Sister    Dementia Sister     Social History Social  History   Tobacco Use   Smoking status: Former    Types: Cigarettes    Quit date: 02/05/1970    Years since quitting: 52.3   Smokeless tobacco: Never  Vaping Use   Vaping Use: Never used  Substance Use Topics   Alcohol use: No   Drug use: No     Allergies   Baclofen, Contrast media [iodinated  contrast media], Latex, Morphine and related, Neosporin [neomycin-bacitracin zn-polymyx], Neosporin [neomycin-polymyxin-gramicidin], Nsaids, Robaxin [methocarbamol], Adhesive [tape], Buspar [buspirone], Carbamazepine, Ciprofloxacin, Iohexol, Oxycodone, Relafen [nabumetone], and Zoloft [sertraline hcl]   Review of Systems Review of Systems Per HPI  Physical Exam Triage Vital Signs ED Triage Vitals  Enc Vitals Group     BP 05/29/22 0941 131/74     Pulse Rate 05/29/22 0941 87     Resp 05/29/22 0941 20     Temp 05/29/22 0941 98.4 F (36.9 C)     Temp Source 05/29/22 0941 Oral     SpO2 05/29/22 0941 95 %     Weight --      Height --      Head Circumference --      Peak Flow --      Pain Score 05/29/22 0940 0     Pain Loc --      Pain Edu? --      Excl. in [place]? --    No data found.  Updated Vital Signs BP 131/74 (BP Location: Right Arm)   Pulse 87   Temp 98.4 F (36.9 C) (Oral)   Resp 20   SpO2 95%   Oxygen saturation recheck after DuoNeb: 97% on room air  Visual Acuity Right Eye Distance:   Left Eye Distance:   Bilateral Distance:    Right Eye Near:   Left Eye Near:    Bilateral Near:     Physical Exam Vitals and nursing note reviewed.  Constitutional:      General: She is not in acute distress.    Appearance: Normal appearance. She is not ill-appearing or toxic-appearing.  HENT:     Head: Normocephalic and atraumatic.     Right Ear: Tympanic membrane, ear canal and external ear normal.     Left Ear: Tympanic membrane, ear canal and external ear normal.     Nose: Rhinorrhea present. No congestion.     Mouth/Throat:     Mouth: Mucous membranes are moist.      Pharynx: Oropharynx is clear. Posterior oropharyngeal erythema present. No oropharyngeal exudate.     Comments: Cobblestoning of posterior pharynx Eyes:     General: No scleral icterus.    Extraocular Movements: Extraocular movements intact.  Cardiovascular:     Rate and Rhythm: Normal rate and regular rhythm.  Pulmonary:     Effort: Pulmonary effort is normal. No respiratory distress.     Breath sounds: Wheezing present. No rhonchi or rales.  Abdominal:     General: Abdomen is flat. Bowel sounds are normal. There is no distension.     Palpations: Abdomen is soft.     Tenderness: There is no abdominal tenderness.  Musculoskeletal:     Cervical back: Normal range of motion and neck supple.  Lymphadenopathy:     Cervical: No cervical adenopathy.  Skin:    General: Skin is warm and dry.     Coloration: Skin is not jaundiced or pale.     Findings: No erythema or rash.  Neurological:     Mental Status: She is alert and oriented to person, place, and time.     Motor: No weakness.  Psychiatric:        Behavior: Behavior is cooperative.      UC Treatments / Results  Labs (all labs ordered are listed, but only abnormal results are displayed) Labs Reviewed - No data to display  EKG   Radiology DG Chest 2 View  Result Date: 05/29/2022 CLINICAL DATA:  Cough and wheezing over the last 2 weeks EXAM: CHEST - 2 VIEW COMPARISON:  03/23/2015 FINDINGS: Heart and mediastinal shadows are normal. There is bronchial thickening but no infiltrate, collapse or effusion. Ordinary mild degenerative changes affect the spine. IMPRESSION: Bronchitis pattern. No consolidation or collapse. Electronically Signed   By: Nelson Chimes M.D.   On: 05/29/2022 10:15    Procedures Procedures (including critical care time)  Medications Ordered in UC Medications  methylPREDNISolone sodium succinate (SOLU-MEDROL) 125 mg/2 mL injection 60 mg (has no administration in time range)  ipratropium (ATROVENT) nebulizer  solution 0.5 mg (0.5 mg Nebulization Given 05/29/22 1017)  albuterol (PROVENTIL) (2.5 MG/3ML) 0.083% nebulizer solution 2.5 mg (2.5 mg Nebulization Given 05/29/22 1017)    Initial Impression / Assessment and Plan / UC Course  I have reviewed the triage vital signs and the nursing notes.  Pertinent labs & imaging results that were available during my care of the patient were reviewed by me and considered in my medical decision making (see chart for details).   Patient is well-appearing, normotensive, afebrile, not tachycardic, not tachypneic, oxygenating well on room air.    1. Wheezing DuoNeb given in urgent care today with relief of wheezing Recommended continuing to use albuterol nebulizer every 4-6 hours as needed for wheezing, shortness of breath, or spasmy cough She has an nebulizer machine from her deceased husband Chest x-ray today is negative for acute cardiopulmonary disease Will also treat with corticosteroids - Solu-Medrol given in urgent care today; start oral prednisone tomorrow Other supportive care discussed including Tessalon Perles, pushing hydration ER and return precautions discussed with patient  2. Viral URI with cough Suspect viral etiology; however patient is out of window for viral testing and she tested negative for influenza, RSV, and COVID-19 Supportive care discussed Start Tessalon Perles ER and return precautions discussed with patient and daughter  The patient was given the opportunity to ask questions.  All questions answered to their satisfaction.  The patient is in agreement to this plan.    Final Clinical Impressions(s) / UC Diagnoses   Final diagnoses:  Wheezing  Viral URI with cough     Discharge Instructions      As we discussed, I suspect you had a viral upper respiratory infection that has now caused lung inflammation also known as wheezing in your chest.  The chest x-ray today does not show any pneumonia.  We have given you a breathing  treatment today-continue to use a breathing treatment at home every 4-6 hours as needed for wheezing, shortness of breath, or coughing.  We have also given you a shot of steroid today that should help with the lung inflammation.  Start [employer] as needed for dry cough.  Starting tomorrow, you can start the prednisone daily in the morning to help with lung inflammation.  Seek care if symptoms persist or worsen despite treatment.     ED Prescriptions     Medication Sig Dispense Auth. Provider   albuterol (PROVENTIL) (2.5 MG/3ML) 0.083% nebulizer solution Take 3 mLs (2.5 mg total) by nebulization every 6 (six) hours as needed for wheezing or shortness of breath. 75 mL Noemi Chapel A, NP   benzonatate (TESSALON) 100 MG capsule Take 1 capsule (100 mg total) by mouth 3 (three) times daily as needed for cough. Do not take with alcohol or while driving or operating heavy machinery.  May cause drowsiness. 21 capsule Noemi Chapel A, NP   predniSONE (DELTASONE) 20 MG tablet Take 2 tablets (40  mg total) by mouth daily with breakfast for 5 days. 10 tablet Eulogio Bear, NP      PDMP not reviewed this encounter.   Eulogio Bear, NP 05/29/22 1044

## 2022-06-14 ENCOUNTER — Ambulatory Visit
Admission: RE | Admit: 2022-06-14 | Discharge: 2022-06-14 | Disposition: A | Discharge status: Home / Self Care | Payer: Medicare HMO | Source: Ambulatory Visit | Attending: Internal Medicine | Admitting: Internal Medicine

## 2022-06-14 DIAGNOSIS — M810 Age-related osteoporosis without current pathological fracture: Secondary | ICD-10-CM | POA: Diagnosis not present

## 2022-06-14 DIAGNOSIS — Z1231 Encounter for screening mammogram for malignant neoplasm of breast: Secondary | ICD-10-CM | POA: Diagnosis not present

## 2022-06-14 DIAGNOSIS — M8588 Other specified disorders of bone density and structure, other site: Secondary | ICD-10-CM | POA: Diagnosis not present

## 2022-06-14 DIAGNOSIS — Z780 Asymptomatic menopausal state: Secondary | ICD-10-CM | POA: Diagnosis not present

## 2022-07-12 DIAGNOSIS — H524 Presbyopia: Secondary | ICD-10-CM | POA: Diagnosis not present

## 2022-11-09 DIAGNOSIS — M545 Low back pain, unspecified: Secondary | ICD-10-CM | POA: Diagnosis not present

## 2022-11-15 DIAGNOSIS — M545 Low back pain, unspecified: Secondary | ICD-10-CM | POA: Diagnosis not present

## 2022-11-15 DIAGNOSIS — M48061 Spinal stenosis, lumbar region without neurogenic claudication: Secondary | ICD-10-CM | POA: Diagnosis not present

## 2023-01-31 ENCOUNTER — Other Ambulatory Visit: Payer: Self-pay | Admitting: Physician Assistant

## 2023-03-14 DIAGNOSIS — Z23 Encounter for immunization: Secondary | ICD-10-CM | POA: Diagnosis not present

## 2023-03-14 DIAGNOSIS — Z0000 Encounter for general adult medical examination without abnormal findings: Secondary | ICD-10-CM | POA: Diagnosis not present

## 2023-03-14 DIAGNOSIS — Z9181 History of falling: Secondary | ICD-10-CM | POA: Diagnosis not present

## 2023-05-02 ENCOUNTER — Other Ambulatory Visit: Payer: Self-pay | Admitting: Internal Medicine

## 2023-05-02 DIAGNOSIS — Z1231 Encounter for screening mammogram for malignant neoplasm of breast: Secondary | ICD-10-CM

## 2023-05-08 ENCOUNTER — Other Ambulatory Visit: Payer: Self-pay | Admitting: Physician Assistant

## 2023-05-09 ENCOUNTER — Other Ambulatory Visit: Payer: Self-pay | Admitting: Physician Assistant

## 2023-05-11 ENCOUNTER — Other Ambulatory Visit: Payer: Self-pay | Admitting: Physician Assistant

## 2023-05-11 ENCOUNTER — Telehealth: Payer: Self-pay | Admitting: Physician Assistant

## 2023-05-11 NOTE — Telephone Encounter (Signed)
 Error

## 2023-05-14 DIAGNOSIS — G500 Trigeminal neuralgia: Secondary | ICD-10-CM | POA: Diagnosis not present

## 2023-05-14 DIAGNOSIS — H35372 Puckering of macula, left eye: Secondary | ICD-10-CM | POA: Diagnosis not present

## 2023-05-18 DIAGNOSIS — R519 Headache, unspecified: Secondary | ICD-10-CM | POA: Diagnosis not present

## 2023-06-15 ENCOUNTER — Ambulatory Visit
Admission: RE | Admit: 2023-06-15 | Discharge: 2023-06-15 | Disposition: A | Discharge status: Home / Self Care | Payer: Medicare HMO | Source: Ambulatory Visit | Attending: Internal Medicine | Admitting: Internal Medicine

## 2023-06-15 DIAGNOSIS — Z1231 Encounter for screening mammogram for malignant neoplasm of breast: Secondary | ICD-10-CM

## 2023-09-26 DIAGNOSIS — M5441 Lumbago with sciatica, right side: Secondary | ICD-10-CM | POA: Diagnosis not present

## 2023-09-26 DIAGNOSIS — G8929 Other chronic pain: Secondary | ICD-10-CM | POA: Diagnosis not present

## 2023-09-26 DIAGNOSIS — M5442 Lumbago with sciatica, left side: Secondary | ICD-10-CM | POA: Diagnosis not present

## 2023-09-26 DIAGNOSIS — M5416 Radiculopathy, lumbar region: Secondary | ICD-10-CM | POA: Diagnosis not present

## 2023-10-04 DIAGNOSIS — R1030 Lower abdominal pain, unspecified: Secondary | ICD-10-CM | POA: Diagnosis not present

## 2023-10-04 DIAGNOSIS — R399 Unspecified symptoms and signs involving the genitourinary system: Secondary | ICD-10-CM | POA: Diagnosis not present

## 2023-10-10 ENCOUNTER — Encounter (HOSPITAL_BASED_OUTPATIENT_CLINIC_OR_DEPARTMENT_OTHER): Payer: Self-pay | Admitting: Internal Medicine

## 2023-10-10 DIAGNOSIS — R1030 Lower abdominal pain, unspecified: Secondary | ICD-10-CM

## 2023-10-11 ENCOUNTER — Other Ambulatory Visit (HOSPITAL_BASED_OUTPATIENT_CLINIC_OR_DEPARTMENT_OTHER): Payer: Self-pay | Admitting: Internal Medicine

## 2023-10-11 DIAGNOSIS — R1030 Lower abdominal pain, unspecified: Secondary | ICD-10-CM

## 2023-10-11 DIAGNOSIS — R1084 Generalized abdominal pain: Secondary | ICD-10-CM

## 2023-10-13 ENCOUNTER — Ambulatory Visit (HOSPITAL_BASED_OUTPATIENT_CLINIC_OR_DEPARTMENT_OTHER)

## 2023-10-13 ENCOUNTER — Ambulatory Visit (HOSPITAL_BASED_OUTPATIENT_CLINIC_OR_DEPARTMENT_OTHER): Admission: RE | Admit: 2023-10-13 | Source: Ambulatory Visit

## 2023-10-13 ENCOUNTER — Ambulatory Visit (HOSPITAL_BASED_OUTPATIENT_CLINIC_OR_DEPARTMENT_OTHER)
Admission: RE | Admit: 2023-10-13 | Discharge: 2023-10-13 | Discharge status: Home / Self Care | Source: Ambulatory Visit | Attending: Internal Medicine | Admitting: Internal Medicine

## 2023-10-13 DIAGNOSIS — R102 Pelvic and perineal pain: Secondary | ICD-10-CM | POA: Diagnosis not present

## 2023-10-13 DIAGNOSIS — R1030 Lower abdominal pain, unspecified: Secondary | ICD-10-CM | POA: Insufficient documentation

## 2023-10-13 DIAGNOSIS — Z90710 Acquired absence of both cervix and uterus: Secondary | ICD-10-CM | POA: Diagnosis not present

## 2023-10-15 ENCOUNTER — Ambulatory Visit (HOSPITAL_BASED_OUTPATIENT_CLINIC_OR_DEPARTMENT_OTHER)
Admission: RE | Admit: 2023-10-15 | Discharge: 2023-10-15 | Disposition: A | Discharge status: Home / Self Care | Source: Ambulatory Visit | Attending: Internal Medicine | Admitting: Internal Medicine

## 2023-10-15 DIAGNOSIS — R1084 Generalized abdominal pain: Secondary | ICD-10-CM | POA: Diagnosis not present

## 2023-10-15 DIAGNOSIS — Z9049 Acquired absence of other specified parts of digestive tract: Secondary | ICD-10-CM | POA: Diagnosis not present

## 2023-11-20 ENCOUNTER — Other Ambulatory Visit: Payer: Self-pay | Admitting: Internal Medicine

## 2023-11-20 DIAGNOSIS — Z1231 Encounter for screening mammogram for malignant neoplasm of breast: Secondary | ICD-10-CM

## 2023-11-20 DIAGNOSIS — H5203 Hypermetropia, bilateral: Secondary | ICD-10-CM | POA: Diagnosis not present

## 2023-12-18 ENCOUNTER — Other Ambulatory Visit (INDEPENDENT_AMBULATORY_CARE_PROVIDER_SITE_OTHER)

## 2023-12-18 ENCOUNTER — Encounter: Payer: Self-pay | Admitting: Nurse Practitioner

## 2023-12-18 ENCOUNTER — Ambulatory Visit: Admitting: Nurse Practitioner

## 2023-12-18 VITALS — BP 136/60 | HR 77 | Ht <= 58 in | Wt 98.4 lb

## 2023-12-18 DIAGNOSIS — K5900 Constipation, unspecified: Secondary | ICD-10-CM

## 2023-12-18 DIAGNOSIS — R1030 Lower abdominal pain, unspecified: Secondary | ICD-10-CM

## 2023-12-18 DIAGNOSIS — R109 Unspecified abdominal pain: Secondary | ICD-10-CM

## 2023-12-18 DIAGNOSIS — K5909 Other constipation: Secondary | ICD-10-CM

## 2023-12-18 DIAGNOSIS — Z800 Family history of malignant neoplasm of digestive organs: Secondary | ICD-10-CM

## 2023-12-18 LAB — CBC WITH DIFFERENTIAL/PLATELET
Basophils Absolute: 0.1 K/uL (ref 0.0–0.1)
Basophils Relative: 0.9 % (ref 0.0–3.0)
Eosinophils Absolute: 0.2 K/uL (ref 0.0–0.7)
Eosinophils Relative: 3.1 % (ref 0.0–5.0)
HCT: 40.2 % (ref 36.0–46.0)
Hemoglobin: 13.5 g/dL (ref 12.0–15.0)
Lymphocytes Relative: 35.3 % (ref 12.0–46.0)
Lymphs Abs: 2.6 K/uL (ref 0.7–4.0)
MCHC: 33.5 g/dL (ref 30.0–36.0)
MCV: 90.7 fl (ref 78.0–100.0)
Monocytes Absolute: 0.7 K/uL (ref 0.1–1.0)
Monocytes Relative: 10.1 % (ref 3.0–12.0)
Neutro Abs: 3.7 K/uL (ref 1.4–7.7)
Neutrophils Relative %: 50.6 % (ref 43.0–77.0)
Platelets: 286 K/uL (ref 150.0–400.0)
RBC: 4.43 Mil/uL (ref 3.87–5.11)
RDW: 12.7 % (ref 11.5–15.5)
WBC: 7.3 K/uL (ref 4.0–10.5)

## 2023-12-18 MED ORDER — PREDNISONE 50 MG PO TABS: ORAL_TABLET | ORAL | 0 refills | Status: DC

## 2023-12-18 NOTE — Progress Notes (Addendum)
 12/18/2023 Valerie Hampton [id number] [birth year]   Chief Complaint: Lower abdominal pain  History of Present Illness: History of anxiety, depression, osteoporosis, GERD, esophageal dysmotility and constipation. Past cecectomy and total hysterectomy. She is known by Dr. Albertus.  She was last seen in office by Delon Failing, PA-C 01/16/2022 and at that time, the patient endorsed having mild reflux symptoms after running out of Omeprazole  for few weeks as well as issues with constipation. She also requested to schedule colonoscopy. Colonoscopy 01/04/2006 was normal, a repeat colonoscopy in 5 years was recommended but was not done. Mother was diagnosed with colon cancer in her 82s. She presents today for further evaluation regarding right mid abdominal and lower abdominal pain which has persisted for the past 3 months.She takes MiraLAX  intermittently and when she takes a full dose daily she has stringy or loose stools. No bloody stools. She was seen by her gynecologist and underwent a pelvic ultrasound 10/13/2023 which showed evidence of status post TAH/BSO otherwise was unremarkable. An abdominal sonogram 10/15/2023 showed evidence of chronic renal disease otherwise was unremarkable. She denies having any nausea or vomiting. She noted seeing her orthopedist the end of June due to having lower back pain with left sciatica pain for which she was prescribed a course of Prednisone  and she noted her lower abdominal pain improved while she was on prednisone  and recurred a week after she completed the last dose. She reported having several urinalysis studies which were negative for UTI.  She denies having any fevers or weight loss.  She endorses having memory issues and takes notes so she does not forget things.  IMAGE STUDIES:  Abd sono Sono 10/15/2023:  Gallbladder: Surgically absent   Common bile duct: Diameter: 6 mm   Liver: No focal lesion identified. Within normal limits in parenchymal  echogenicity. Portal vein is patent on color Doppler imaging with normal direction of blood flow towards the liver.   IVC: No abnormality visualized.   Pancreas: Visualized portion unremarkable.   Spleen: Size and appearance within normal limits.   Right Kidney: Length: 8.5 cm. Mild increased echogenicity the renal cortex without significant thinning. No mass or hydronephrosis visualized.   Left Kidney: Length: 9.0 cm. Mild increased echogenicity the renal cortex without significant thinning. No mass or hydronephrosis visualized.  Complete hysterectomy   Pelvic sonogram: Unremarkable examination of the pelvis status post TAH/BSO.   Past Medical History:  Diagnosis Date   Allergic rhinitis    Anxiety    Arthritis    Candida esophagitis (HCC)    Cataract    Chronic back pain    Depression    Family history of anesthesia complication     sister went out ?     Fibromyalgia    Gallstones    GERD (gastroesophageal reflux disease)    Headache(784.0)    due ot facial nerve condition    Hiatal hernia    Intraductal papilloma 2007   no evidence of malignancy   Occipital neuralgia    Osteopenia    PONV (postoperative nausea and vomiting)    Past Surgical History:  Procedure Laterality Date   ABDOMINAL HYSTERECTOMY     BACK SURGERY     bladder tack     BREAST EXCISIONAL BIOPSY Bilateral    benign   BREAST SURGERY Bilateral    CERVICAL SPINE SURGERY     CHOLECYSTECTOMY N/A 03/24/2015   Procedure: LAPAROSCOPIC CHOLECYSTECTOMY;  Surgeon: Oneil Budge, MD;  Location: [facility];  Service: General;  Laterality: N/A;   DILATION AND CURETTAGE OF UTERUS     EXCISIONAL HEMORRHOIDECTOMY     LUMBAR LAMINECTOMY/DECOMPRESSION MICRODISCECTOMY N/A 09/09/2013   Procedure: LUMBAR LAMINECTOMY/DECOMPRESSION L4-5 MICRODISCECTOMY LEFT L4-5;  Surgeon: Tanda DELENA Heading, MD;  Location: [facility];  Service: Orthopedics;  Laterality: N/A;   NECK SURGERY     TEMPORAL ARTERY BIOPSY / LIGATION Right     TUBAL LIGATION     Current Medications, Allergies, Past Medical History, Past Surgical History, Family History and Social History were reviewed in [employer] record.  Review of Systems:   Constitutional: Negative for fever, sweats, chills or weight loss.  Respiratory: Negative for shortness of breath.   Cardiovascular: Negative for chest pain, palpitations and leg swelling.  Gastrointestinal: See HPI.  Musculoskeletal:+ Back pain.  Neurological: + Memory issues. Negative for dizziness, headaches or paresthesias.   Physical Exam: BP 136/60   Pulse 77   Ht 4' 9 (1.448 m)   Wt 98 lb 6 oz (44.6 kg)   BMI 21.29 kg/m  Wt Readings from Last 3 Encounters:  12/18/23 98 lb 6 oz (44.6 kg)  01/16/22 98 lb 6 oz (44.6 kg)  03/11/20 100 lb (45.4 kg)    General: 83 year old female in no acute distress. Head: Normocephalic and atraumatic. Eyes: No scleral icterus. Conjunctiva pink . Ears: Normal auditory acuity. Mouth: Dentition intact. No ulcers or lesions.  Lungs: Clear throughout to auscultation. Heart: Regular rate and rhythm, no murmur. Abdomen: Soft, nondistended. Mild tenderness to the right mid abdomen and central lower abdomen without rebound or guarding. No masses or hepatomegaly. Normal bowel sounds x 4 quadrants.  Rectal: Deferred.  Musculoskeletal: Symmetrical with no gross deformities. Extremities: No edema. Neurological: Alert oriented x 4. No focal deficits.  Psychological: Alert and cooperative. Normal mood and affect  Assessment and Recommendations:  83 year old female with right mid abdominal and lower abdominal pain x 3 months. Pelvic ultrasound was unrevealing. - CBC, CMP - CTAP with oral contrast only (patient is allergic to IV contrast) - Further recommendations to be determined after CT and lab results reviewed - Patient instructed to go to the emergency room if she develops severe abdominal pain  Chronic constipation - Half dose of  MiraLAX  nightly as tolerated - Drink 6 to 8 glasses of water daily - Dietary fiber as tolerated  Mother with history of colon cancer. Colonoscopy in 2007 was normal. - Await CTAP results before providing any further recommendations for a colonoscopy  Today's encounter was 25 minutes which included precharting, chart/result review, history/exam, face-to-face time used for counseling, formulating a treatment plan with follow-up and documentation.  ADDENDUM: CTAP without contrast 01/01/2024 was normal. Colonoscopy recommended. See CTAP result note.

## 2023-12-18 NOTE — Patient Instructions (Addendum)
 Your provider has requested that you go to the basement level for lab work before leaving today. Press B on the elevator. The lab is located at the first door on the left as you exit the elevator.  Go to the Emergency Department if you develop sever abdominal pain.  Reduce Miralax  to 1/2 capful mixed in 8 ounces of water at bedtime as tolerated.  CT scan will only be done with Oral Contrast. No IV contrast.   You have been scheduled for a CT scan of the abdomen and pelvis at [facility], 1st floor Radiology. You are scheduled on 12/26/23 at 5:45 pm. You should arrive at 3:30pm 15 minutes prior to your appointment time for registration.    1) Do not eat anything after 1:45pm (4 hours prior to your test)   2) You will drink 1 bottle of contrast @ 3:45pm (2 hours prior to your exam)    3) You will drink 1 bottle of contrast @ 4:45pm (1 hour prior to your exam)   You may take any medications as prescribed with a small amount of water, if necessary. If you take any of the following medications: METFORMIN, GLUCOPHAGE, GLUCOVANCE, AVANDAMET, RIOMET, FORTAMET, ACTOPLUS MET, JANUMET, GLUMETZA or METAGLIP, you MAY be asked to HOLD this medication 48 hours AFTER the exam.   The purpose of you drinking the oral contrast is to aid in the visualization of your intestinal tract. The contrast solution may cause some diarrhea. Depending on your individual set of symptoms, you may also receive an intravenous injection of x-ray contrast/dye. Plan on being at [facility] for 45 minutes or longer, depending on the type of exam you are having performed.   If you have any questions regarding your exam or if you need to reschedule, you may call [facility] at [phone] between the hours of 8:00 am and 5:00 pm, Monday-Friday.   Thank you for trusting me with your gastrointestinal care!   Elida Shawl, CRNP

## 2023-12-19 ENCOUNTER — Ambulatory Visit: Payer: Self-pay | Admitting: Nurse Practitioner

## 2023-12-22 LAB — COMPREHENSIVE METABOLIC PANEL WITH GFR
ALT: 9 U/L (ref 0–35)
AST: 15 U/L (ref 0–37)
Albumin: 4.7 g/dL (ref 3.5–5.2)
Alkaline Phosphatase: 74 U/L (ref 39–117)
BUN: 19 mg/dL (ref 6–23)
CO2: 30 meq/L (ref 19–32)
Calcium: 9.8 mg/dL (ref 8.4–10.5)
Chloride: 104 meq/L (ref 96–112)
Creatinine, Ser: 0.87 mg/dL (ref 0.40–1.20)
GFR: 62.39 mL/min (ref >60.00)
Glucose, Bld: 85 mg/dL (ref 70–99)
Potassium: 4.2 meq/L (ref 3.5–5.1)
Sodium: 138 meq/L (ref 135–145)
Total Bilirubin: 0.3 mg/dL (ref 0.2–1.2)
Total Protein: 7.4 g/dL (ref 6.0–8.3)

## 2023-12-26 ENCOUNTER — Ambulatory Visit (HOSPITAL_BASED_OUTPATIENT_CLINIC_OR_DEPARTMENT_OTHER)

## 2023-12-26 ENCOUNTER — Ambulatory Visit (HOSPITAL_COMMUNITY)
Admission: RE | Admit: 2023-12-26 | Discharge: 2023-12-26 | Disposition: A | Discharge status: Home / Self Care | Source: Ambulatory Visit | Attending: Nurse Practitioner | Admitting: Nurse Practitioner

## 2023-12-26 DIAGNOSIS — K5900 Constipation, unspecified: Secondary | ICD-10-CM | POA: Diagnosis not present

## 2023-12-26 DIAGNOSIS — R1030 Lower abdominal pain, unspecified: Secondary | ICD-10-CM | POA: Insufficient documentation

## 2024-01-02 ENCOUNTER — Telehealth: Payer: Self-pay | Admitting: Nurse Practitioner

## 2024-01-02 NOTE — Telephone Encounter (Signed)
 Inbound call from patient requesting a call to discuss results from CT scan. Please advise.

## 2024-01-02 NOTE — Telephone Encounter (Signed)
 Pt calling for ct results. Please advise.

## 2024-01-03 NOTE — Telephone Encounter (Signed)
 Linda/Dottie, refer to CTAP result notes I forwarded to you. THX.

## 2024-01-03 NOTE — Progress Notes (Signed)
 Addendum: Reviewed and agree with assessment and management plan. It appears that she has a right inguinal hernia containing a portion of the cecum Given family history of colon cancer colonoscopy is reasonable Colonoscopy will have prior then based on risk given presence of a right inguinal hernia but a right inguinal hernia containing cecum is low risk than hernia containing other portions of the colon. This is good knowledge is at prior to colonoscopy If pain continues she could consider inguinal hernia repair with general surgery. JMP Kirbi Farrugia, Gordy HERO, MD

## 2024-01-25 ENCOUNTER — Ambulatory Visit
Admission: EM | Admit: 2024-01-25 | Discharge: 2024-01-25 | Disposition: A | Discharge status: Home / Self Care | Attending: Nurse Practitioner | Admitting: Nurse Practitioner

## 2024-01-25 DIAGNOSIS — S81812A Laceration without foreign body, left lower leg, initial encounter: Secondary | ICD-10-CM

## 2024-01-25 DIAGNOSIS — Z23 Encounter for immunization: Secondary | ICD-10-CM

## 2024-01-25 MED ORDER — MUPIROCIN 2 % EX OINT: 1.0000 | TOPICAL_OINTMENT | Freq: Two times a day (BID) | CUTANEOUS | 0 refills | Status: AC

## 2024-01-25 MED ORDER — TETANUS-DIPHTH-ACELL PERTUSSIS 5-2-15.5 LF-MCG/0.5 IM SUSP
0.5000 mL | Freq: Once | INTRAMUSCULAR | Status: AC
  Administered 2024-01-25: 0.5 mL via INTRAMUSCULAR

## 2024-01-25 NOTE — Discharge Instructions (Addendum)
 We put 6 stitches in your cut today to close it up.  Please return in 10-14 days to have the stitches taken out.  If you develop signs or symptoms of infection before that, please return here for re evaluation.  Keep the first dressing on for 24 hour, then remove the dressing and you can clean the area like normal with soap and water.  After patting the area dry, apply a thin layer of the mupirocin  ointment and cover with nonadherent gauze and tape.  Continue this twice daily until you return to have the stitches out.    Tdap has been updated today.

## 2024-01-25 NOTE — ED Notes (Signed)
 Bacitracin  ointment, nonadherent dressing applied, reinforced with coban. Pt tolerated well.   Site management and infection prevention education reviewed. Pt and pt family verbalized understanding.

## 2024-01-25 NOTE — ED Triage Notes (Signed)
 Pt reports she cut her left shin on a metal bed frame about an 1hr ago.   Last t dap unknown

## 2024-01-25 NOTE — ED Provider Notes (Signed)
 [facility]    CSN: [id number] Arrival date & time: 01/25/24  1419      History   Chief Complaint No chief complaint on file.   HPI ARIYONNA Hampton is a 83 y.o. female.   Patient presents today with laceration to left lower extremity that occurred approximately 1 hour prior to arrival.  Reports she cut her leg on a bed frame.  Reports it bled a lot and is very painful.  No decreased range of motion of the left lower extremity, ankle, or foot.  No numbness or tingling in the toes.  Last Tdap unknown.    Past Medical History:  Diagnosis Date   Allergic rhinitis    Anxiety    Arthritis    Candida esophagitis (HCC)    Cataract    Chronic back pain    Depression    Family history of anesthesia complication     sister went out ?     Fibromyalgia    Gallstones    GERD (gastroesophageal reflux disease)    Headache(784.0)    due ot facial nerve condition    Hiatal hernia    Intraductal papilloma 2007   no evidence of malignancy   Occipital neuralgia    Osteopenia    PONV (postoperative nausea and vomiting)     Patient Active Problem List   Diagnosis Date Noted   Biliary colic 03/23/2015   Cholelithiasis 03/23/2015   Acute calculous cholecystitis 03/23/2015   Spinal stenosis, lumbar region, with neurogenic claudication 09/09/2013   Herniated lumbar intervertebral disc 09/09/2013   Spinal stenosis of lumbar region 09/06/2013   Right facial pain 03/11/2013   CARCINOMA IN SITU OF BREAST 10/10/2007   ESOPHAGEAL STRICTURE 10/10/2007   CONSTIPATION 10/10/2007   IRRITABLE BOWEL SYNDROME 10/10/2007   CERVICAL DISC DISORDER 10/10/2007   CANDIDIASIS, ESOPHAGEAL 06/13/2007   EROSIVE ESOPHAGITIS 06/13/2007   ESOPHAGEAL MOTILITY DISORDER 06/13/2007   GERD 06/13/2007   FIBROMYALGIA 06/13/2007    Past Surgical History:  Procedure Laterality Date   ABDOMINAL HYSTERECTOMY     BACK SURGERY     bladder tack     BREAST EXCISIONAL BIOPSY Bilateral    benign    BREAST SURGERY Bilateral    CERVICAL SPINE SURGERY     CHOLECYSTECTOMY N/A 03/24/2015   Procedure: LAPAROSCOPIC CHOLECYSTECTOMY;  Surgeon: Oneil Budge, MD;  Location: [facility];  Service: General;  Laterality: N/A;   DILATION AND CURETTAGE OF UTERUS     EXCISIONAL HEMORRHOIDECTOMY     LUMBAR LAMINECTOMY/DECOMPRESSION MICRODISCECTOMY N/A 09/09/2013   Procedure: LUMBAR LAMINECTOMY/DECOMPRESSION L4-5 MICRODISCECTOMY LEFT L4-5;  Surgeon: Tanda DELENA Heading, MD;  Location: [facility];  Service: Orthopedics;  Laterality: N/A;   NECK SURGERY     TEMPORAL ARTERY BIOPSY / LIGATION Right    TUBAL LIGATION      OB History     Gravida  7   Para  4   Term  4   Preterm      AB  3   Living  3      SAB  3   IAB      Ectopic      Multiple      Live Births               Home Medications    Prior to Admission medications   Medication Sig Start Date End Date Taking? Authorizing Provider  mupirocin  ointment (BACTROBAN ) 2 % Apply 1 Application topically 2 (two) times daily for 5 days.  01/25/24 01/30/24 Yes Chandra Harlene LABOR, NP  acetaminophen  (TYLENOL ) 500 MG tablet Take 1,000 mg by mouth every 6 (six) hours as needed for fever.    [provider]  albuterol  (PROVENTIL ) (2.5 MG/3ML) 0.083% nebulizer solution Take 3 mLs (2.5 mg total) by nebulization every 6 (six) hours as needed for wheezing or shortness of breath. 05/29/22   Chandra Harlene LABOR, NP  ALPRAZolam  (XANAX ) 0.25 MG tablet Take 0.25 mg by mouth daily as needed. 10/28/21   [provider]  benzonatate  (TESSALON ) 100 MG capsule Take 1 capsule (100 mg total) by mouth 3 (three) times daily as needed for cough. Do not take with alcohol or while driving or operating heavy machinery.  May cause drowsiness. 05/29/22   Chandra Harlene LABOR, NP  citalopram (CELEXA) 10 MG tablet Take 10 mg by mouth daily.    [provider]  HYDROcodone -acetaminophen  (NORCO/VICODIN) 5-325 MG tablet Take 1 tablet by mouth. 12/17/15    [provider]  polyethylene glycol (MIRALAX  / GLYCOLAX ) packet Take 17 g by mouth daily.    [provider]  triamcinolone  cream (KENALOG) 0.5 % Apply 1 application topically 3 (three) times daily. 03/22/15   [provider]    Family History Family History  Problem Relation Age of Onset   Cancer - Colon Mother    Breast cancer Mother    Alzheimer's disease Mother    Diabetes Father    Breast cancer Daughter    Colon polyps Sister    Heart disease Sister    Breast cancer Sister    Heart disease Brother    Stomach cancer Brother    Esophageal cancer Son    Ovarian cancer Sister    Heart disease Sister    Scleroderma Sister    Heart disease Sister    Heart disease Sister    Hypertension Sister    Dementia Sister     Social History Social History   Tobacco Use   Smoking status: Former    Current packs/day: 0.00    Types: Cigarettes    Quit date: 02/05/1970    Years since quitting: 54.0   Smokeless tobacco: Never  Vaping Use   Vaping status: Never Used  Substance Use Topics   Alcohol use: No   Drug use: No     Allergies   Baclofen, Contrast media [iodinated contrast media], Latex, Morphine  and codeine, Neosporin [neomycin -bacitracin  zn-polymyx], Neosporin [neomycin -polymyxin-gramicidin], Nsaids, Robaxin  [methocarbamol ], Adhesive [tape], Buspar [buspirone], Carbamazepine , Ciprofloxacin, Iohexol, Oxycodone , Relafen [nabumetone], Wound dressing adhesive, and Zoloft [sertraline hcl]   Review of Systems Review of Systems Per HPI  Physical Exam Triage Vital Signs ED Triage Vitals [01/25/24 1439]  Encounter Vitals Group     BP 137/69     Girls Systolic BP Percentile      Girls Diastolic BP Percentile      Boys Systolic BP Percentile      Boys Diastolic BP Percentile      Pulse Rate 82     Resp 18     Temp 98.1 F (36.7 C)     Temp Source Oral     SpO2 97 %     Weight      Height      Head Circumference      Peak Flow      Pain  Score 5     Pain Loc      Pain Education      Exclude from Growth Chart    No data found.  Updated Vital Signs BP 137/69 (BP Location: Right Arm)   Pulse 82   Temp 98.1 F (36.7 C) (Oral)   Resp 18   SpO2 97%   Visual Acuity Right Eye Distance:   Left Eye Distance:   Bilateral Distance:    Right Eye Near:   Left Eye Near:    Bilateral Near:     Physical Exam Vitals and nursing note reviewed.  Constitutional:      General: She is not in acute distress.    Appearance: Normal appearance. She is not toxic-appearing.  HENT:     Mouth/Throat:     Mouth: Mucous membranes are moist.     Pharynx: Oropharynx is clear.  Pulmonary:     Effort: Pulmonary effort is normal. No respiratory distress.  Skin:    General: Skin is warm and dry.     Capillary Refill: Capillary refill takes less than 2 seconds.     Findings: Laceration present.     Comments: Approximately 6 cm L-shaped laceration to LLE; see picture below  Neurological:     Mental Status: She is alert and oriented to person, place, and time.  Psychiatric:        Behavior: Behavior is cooperative.      UC Treatments / Results  Labs (all labs ordered are listed, but only abnormal results are displayed) Labs Reviewed - No data to display  EKG   Radiology No results found.  Procedures Laceration Repair  Date/Time: 01/25/2024 3:48 PM  Performed by: Chandra Harlene LABOR, NP Authorized by: Chandra Harlene LABOR, NP   Consent:    Consent obtained:  Verbal   Consent given by:  Patient   Risks, benefits, and alternatives were discussed: yes     Risks discussed:  Infection, pain, poor cosmetic result, poor wound healing, nerve damage, need for additional repair, vascular damage and tendon damage   Alternatives discussed:  No treatment and delayed treatment Universal protocol:    Procedure explained and questions answered to patient or proxy's satisfaction: yes     Patient identity confirmed:  Verbally with  patient Anesthesia:    Anesthesia method:  Local infiltration   Local anesthetic:  Lidocaine  2% WITH epi Laceration details:    Location:  Leg   Leg location:  L lower leg   Length (cm):  6 Pre-procedure details:    Preparation:  Patient was prepped and draped in usual sterile fashion Exploration:    Limited defect created (wound extended): no     Hemostasis achieved with:  Direct pressure   Wound exploration: wound explored through full range of motion and entire depth of wound visualized     Contaminated: no   Treatment:    Area cleansed with:  Chlorhexidine  and Shur-Clens   Amount of cleaning:  Standard   Irrigation method:  Syringe   Undermining:  None Skin repair:    Repair method:  Sutures   Suture size:  1-0 and 4-0   Suture material:  Nylon   Suture technique:  Simple interrupted   Number of sutures:  6 Approximation:    Approximation:  Close Repair type:    Repair type:  Simple Post-procedure details:    Dressing:  Antibiotic ointment and non-adherent dressing   Procedure completion:  Tolerated well, no immediate complications  (including critical care time)  Medications Ordered in UC Medications  Tdap (ADACEL) injection 0.5 mL (0.5 mLs Intramuscular Given 01/25/24 1534)    Initial Impression / Assessment and Plan / UC Course  I have reviewed the triage vital signs and the nursing notes.  Pertinent labs & imaging results that were available during my care of the patient were reviewed by me and considered in my medical decision making (see chart for details).   Patient is well-appearing, normotensive, afebrile, not tachycardic, not tachypneic, oxygenating well on room air.   1. Laceration of left lower extremity, initial encounter Laceration repair as above Tdap updated today Wound care discussed-apply mupirocin  ointment twice daily after cleaning wound with soap and water Return for signs or symptoms of infection, otherwise in 10 to 14 days for suture  removal  The patient was given the opportunity to ask questions.  All questions answered to their satisfaction.  The patient is in agreement to this plan.   Final Clinical Impressions(s) / UC Diagnoses   Final diagnoses:  Laceration of left lower extremity, initial encounter     Discharge Instructions      We put 6 stitches in your cut today to close it up.  Please return in 10-14 days to have the stitches taken out.  If you develop signs or symptoms of infection before that, please return here for re evaluation.  Keep the first dressing on for 24 hour, then remove the dressing and you can clean the area like normal with soap and water.  After patting the area dry, apply a thin layer of the mupirocin  ointment and cover with nonadherent gauze and tape.  Continue this twice daily until you return to have the stitches out.    Tdap has been updated today.    ED Prescriptions     Medication Sig Dispense Auth. Provider   mupirocin  ointment (BACTROBAN ) 2 % Apply 1 Application topically 2 (two) times daily for 5 days. 22 g Chandra Harlene LABOR, NP      PDMP not reviewed this encounter.   Chandra Harlene LABOR, NP 01/25/24 1550

## 2024-01-31 ENCOUNTER — Ambulatory Visit (AMBULATORY_SURGERY_CENTER): Admitting: *Deleted

## 2024-01-31 VITALS — Ht <= 58 in | Wt 95.0 lb

## 2024-01-31 DIAGNOSIS — K5900 Constipation, unspecified: Secondary | ICD-10-CM

## 2024-01-31 DIAGNOSIS — Z83719 Family history of colon polyps, unspecified: Secondary | ICD-10-CM

## 2024-01-31 DIAGNOSIS — Z800 Family history of malignant neoplasm of digestive organs: Secondary | ICD-10-CM

## 2024-01-31 MED ORDER — PEG 3350-KCL-NA BICARB-NACL 420 G PO SOLR: 4000.0000 mL | Freq: Once | ORAL | 0 refills | Status: AC

## 2024-01-31 MED ORDER — ONDANSETRON HCL 4 MG PO TABS: 4.0000 mg | ORAL_TABLET | Freq: Three times a day (TID) | ORAL | 0 refills | Status: AC | PRN

## 2024-01-31 NOTE — Progress Notes (Signed)
 Pt's name and DOB verified at the beginning of the pre-visit with 2 identifiers  Pt denies any difficulty with ambulating,sitting, laying down or rolling side to side  Pt has no issues moving head neck or  hx of swallowing but resoved per pt  No egg or soy allergy known to patient   No issues known to pt with past sedation  No FH of Malignant Hyperthermia  Pt is not on home 02   Pt is not on blood thinners   Pt has frequent issues with constipation RN instructed pt to use Miralax  per bottles instructions a week before prep days. Pt states they will  Pt is not on dialysis  Pt denise any abnormal heart rhythms   Pt denies any upcoming cardiac testing  Chart not reviewed by CRNA prior to PV    Visit in person  Pt states weight is 95 lb   Pt given  both [facility] main # and MD on call # prior to instructions.  Informed pt to come in at the time discussed and is shown on PV instructions.  Pt instructed to use [employer] or GoodRx for a price reduction on prep  Instructed pt where to find PV instructions in My Chart and a.copy  given to pt  Instructed pt on all aspects of written instructions including med holds clothing to wear and foods to eat and not eat as well as after procedure legal restrictions and to call MD on call if needed.. Pt states understanding. Instructed pt to review instructions again prior to procedure and call main # given if has any questions or any issues. Pt states they will.

## 2024-02-01 ENCOUNTER — Ambulatory Visit
Admission: EM | Admit: 2024-02-01 | Discharge: 2024-02-01 | Disposition: A | Discharge status: Home / Self Care | Source: Home / Self Care

## 2024-02-01 DIAGNOSIS — Z4802 Encounter for removal of sutures: Secondary | ICD-10-CM | POA: Diagnosis not present

## 2024-02-01 NOTE — ED Notes (Signed)
 Dressed pt left shin laceration after suture removal. Cleaned area with hibilclens, applied mupiricin ointment, and a non adherent guaze pad with coban.

## 2024-02-01 NOTE — ED Triage Notes (Signed)
 Pt reports to UC for a sutural removal x 7 days.   Denies any fever, chills, or pain. Reports slight tenderness in the area.

## 2024-02-11 ENCOUNTER — Telehealth: Payer: Self-pay | Admitting: Nurse Practitioner

## 2024-02-11 NOTE — Telephone Encounter (Signed)
 Pt has questions about if she should take both Miralax  and ducolox day before and day of procedure.. RN instructed she should not take Miralax  day before and day of procedure. Instructed pt to keep up with electrolytes  ( suggested  getting non red or purple Gatorade type drink not red or purple.) to prevent her to have any issues and explained possible issues she could have. Pt states she understands. Did quick review of how to do the prep and pt states she understood them. No other questions at this time.SABRA

## 2024-02-11 NOTE — Telephone Encounter (Signed)
 RN attempted to return call to patient about her prep medication; received patient vm; left message with our phone number.

## 2024-02-11 NOTE — Telephone Encounter (Signed)
 Inbound call from patient requesting to speak to the pre-visit nurse in regards to her prep medication. Patient is scheduled for a colonoscopy on November the 25 th. please advise.

## 2024-02-14 ENCOUNTER — Other Ambulatory Visit (HOSPITAL_BASED_OUTPATIENT_CLINIC_OR_DEPARTMENT_OTHER): Payer: Self-pay | Admitting: Internal Medicine

## 2024-02-14 DIAGNOSIS — M810 Age-related osteoporosis without current pathological fracture: Secondary | ICD-10-CM

## 2024-02-19 ENCOUNTER — Encounter: Payer: Self-pay | Admitting: Internal Medicine

## 2024-02-21 ENCOUNTER — Encounter: Admitting: Internal Medicine

## 2024-02-26 ENCOUNTER — Ambulatory Visit: Admitting: Gastroenterology

## 2024-02-26 ENCOUNTER — Encounter: Payer: Self-pay | Admitting: Gastroenterology

## 2024-02-26 VITALS — BP 153/66 | HR 86 | Temp 98.1°F | Resp 22 | Ht <= 58 in | Wt 95.0 lb

## 2024-02-26 DIAGNOSIS — K644 Residual hemorrhoidal skin tags: Secondary | ICD-10-CM | POA: Diagnosis not present

## 2024-02-26 DIAGNOSIS — R1030 Lower abdominal pain, unspecified: Secondary | ICD-10-CM | POA: Diagnosis not present

## 2024-02-26 DIAGNOSIS — K5900 Constipation, unspecified: Secondary | ICD-10-CM

## 2024-02-26 DIAGNOSIS — R1031 Right lower quadrant pain: Secondary | ICD-10-CM

## 2024-02-26 DIAGNOSIS — K648 Other hemorrhoids: Secondary | ICD-10-CM

## 2024-02-26 DIAGNOSIS — Z800 Family history of malignant neoplasm of digestive organs: Secondary | ICD-10-CM

## 2024-02-26 DIAGNOSIS — Z83719 Family history of colon polyps, unspecified: Secondary | ICD-10-CM | POA: Diagnosis not present

## 2024-02-26 DIAGNOSIS — R194 Change in bowel habit: Secondary | ICD-10-CM

## 2024-02-26 DIAGNOSIS — G8929 Other chronic pain: Secondary | ICD-10-CM

## 2024-02-26 DIAGNOSIS — D123 Benign neoplasm of transverse colon: Secondary | ICD-10-CM | POA: Diagnosis not present

## 2024-02-26 MED ORDER — SODIUM CHLORIDE 0.9 % IV SOLN: 500.0000 mL | Freq: Once | INTRAVENOUS | Status: DC

## 2024-02-26 NOTE — Patient Instructions (Addendum)
 One 3 mm polyp in the transverse colon, removed                            with a cold snare. Resected and retrieved.                           - Non-bleeding external and internal hemorrhoids.                           - Hemorrhoids found on perianal exam. Recommendation:           - Resume previous diet.                           - Continue present medications.                           - Await pathology results.                           - No repeat colonoscopy due to age. Handout on polyps given   YOU HAD AN ENDOSCOPIC PROCEDURE TODAY AT THE [facility]:   Refer to the procedure report that was given to you for any specific questions about what was found during the examination.  If the procedure report does not answer your questions, please call your gastroenterologist to clarify.  If you requested that your care partner not be given the details of your procedure findings, then the procedure report has been included in a sealed envelope for you to review at your convenience later.  YOU SHOULD EXPECT: Some feelings of bloating in the abdomen. Passage of more gas than usual.  Walking can help get rid of the air that was put into your GI tract during the procedure and reduce the bloating. If you had a lower endoscopy (such as a colonoscopy or flexible sigmoidoscopy) you may notice spotting of blood in your stool or on the toilet paper. If you underwent a bowel prep for your procedure, you may not have a normal bowel movement for a few days.  Please Note:  You might notice some irritation and congestion in your nose or some drainage.  This is from the oxygen used during your procedure.  There is no need for concern and it should clear up in a day or so.  SYMPTOMS TO REPORT IMMEDIATELY:  Following lower endoscopy (colonoscopy or flexible sigmoidoscopy):  Excessive amounts of blood in the stool  Significant tenderness or worsening of abdominal pains  Swelling of the abdomen that is  new, acute  Fever of 100F or higher   For urgent or emergent issues, a gastroenterologist can be reached at any hour by calling [phone]. Do not use MyChart messaging for urgent concerns.    DIET:  We do recommend a small meal at first, but then you may proceed to your regular diet.  Drink plenty of fluids but you should avoid alcoholic beverages for 24 hours.  ACTIVITY:  You should plan to take it easy for the rest of today and you should NOT DRIVE or use heavy machinery until tomorrow (because of the sedation medicines used during the test).    FOLLOW UP: Our staff will call the number listed on your records the next business  day following your procedure.  We will call around 7:15- 8:00 am to check on you and address any questions or concerns that you may have regarding the information given to you following your procedure. If we do not reach you, we will leave a message.     If any biopsies were taken you will be contacted by phone or by letter within the next 1-3 weeks.  Please call us  at [phone] if you have not heard about the biopsies in 3 weeks.    SIGNATURES/CONFIDENTIALITY: You and/or your care partner have signed paperwork which will be entered into your electronic medical record.  These signatures attest to the fact that that the information above on your After Visit Summary has been reviewed and is understood.  Full responsibility of the confidentiality of this discharge information lies with you and/or your care-partner.

## 2024-02-26 NOTE — Progress Notes (Signed)
 Vss nad trans to pacu

## 2024-02-26 NOTE — Progress Notes (Signed)
 Called to room to assist during endoscopic procedure.  Patient ID and intended procedure confirmed with present staff. Received instructions for my participation in the procedure from the performing physician.

## 2024-02-26 NOTE — Op Note (Addendum)
 [facility] Patient Name: Valerie Hampton Procedure Date: 02/26/2024 2:01 PM MRN: [id number] Endoscopist: Gustav ALONSO Mcgee , MD, [id number] Age: 83 Referring MD:  Date of Birth: 05-11-1940 Gender: Female Account #: [id number] Procedure:                Colonoscopy Indications:              Abdominal pain in the right lower quadrant, Change                            in bowel habits, Constipation, mother had colon                            cancer, abnormal CT abd & pelvis Medicines:                Monitored Anesthesia Care Procedure:                Pre-Anesthesia Assessment:                           - Prior to the procedure, a History and Physical                            was performed, and patient medications and                            allergies were reviewed. The patient's tolerance of                            previous anesthesia was also reviewed. The risks                            and benefits of the procedure and the sedation                            options and risks were discussed with the patient.                            All questions were answered, and informed consent                            was obtained. Prior Anticoagulants: The patient has                            taken no anticoagulant or antiplatelet agents. ASA                            Grade Assessment: III - A patient with severe                            systemic disease. After reviewing the risks and                            benefits, the patient was deemed in satisfactory  condition to undergo the procedure.                           After obtaining informed consent, the colonoscope                            was passed under direct vision. Throughout the                            procedure, the patient's blood pressure, pulse, and                            oxygen saturations were monitored continuously. The                            Olympus Scope  SN [id number] was introduced through the                            anus and advanced to the the cecum, identified by                            appendiceal orifice and ileocecal valve. The                            colonoscopy was performed without difficulty. The                            patient tolerated the procedure well. The quality                            of the bowel preparation was adequate. The                            ileocecal valve, appendiceal orifice, and rectum                            were photographed. Scope In: 2:08:21 PM Scope Out: 2:27:12 PM Scope Withdrawal Time: 0 hours 9 minutes 7 seconds  Total Procedure Duration: 0 hours 18 minutes 51 seconds  Findings:                 A 3 mm polyp was found in the transverse colon. The                            polyp was sessile. The polyp was removed with a                            cold snare. Resection and retrieval were complete.                           Non-bleeding external and internal hemorrhoids were                            found during retroflexion. The hemorrhoids were  medium-sized, prolapsed.                           Hemorrhoids were found on perianal exam. Complications:            No immediate complications. Estimated Blood Loss:     Estimated blood loss: none. Impression:               - One 3 mm polyp in the transverse colon, removed                            with a cold snare. Resected and retrieved.                           - Non-bleeding external and internal hemorrhoids.                           - Hemorrhoids found on perianal exam. Recommendation:           - Resume previous diet.                           - Continue present medications.                           - Await pathology results.                           - No repeat colonoscopy due to age.                           - Follow up in GI office with Dr Albertus or POD A                            APP as  needed                           - Continue daily Miralax , titrate to 1-2 soft BM                            per day                           - Refer to [facility] for R inguinal hernia repair Gustav ALONSO Mcgee, MD 02/26/2024 2:41:36 PM This report has been signed electronically.

## 2024-02-26 NOTE — Progress Notes (Signed)
 [facility] History and Physical   Primary Care Physician:  Rexanne Ingle, MD   Reason for Procedure:  Right lower abdominal pain, worsening constipation, family history of colon cancer  Plan:     colonoscopy with possible interventions as needed     HPI: Valerie Hampton is a very pleasant 83 y.o. female here for colonoscopy for right lower abdominal pain, worsening constipation, family history of colon cancer.  Reviewed recent CT abdomen pelvis September 2025, showed protrusion of small portion of cecum into right inguinal canal  Please refer to office visit note by Elida Nyle Sharps December 18, 2023 for additional details  The risks and benefits as well as alternatives of endoscopic procedure(s) have been discussed and reviewed.  The patient was provided an opportunity to ask questions and all were answered. The patient agreed with the plan and demonstrated an understanding of the instructions.   Past Medical History:  Diagnosis Date   Allergic rhinitis    Anxiety    Arthritis    Candida esophagitis (HCC)    Cataract    Chronic back pain    Depression    Family history of anesthesia complication     sister went out ?     Fibromyalgia    Gallstones    GERD (gastroesophageal reflux disease)    Headache(784.0)    due ot facial nerve condition    Hiatal hernia    Intraductal papilloma 2007   no evidence of malignancy   Occipital neuralgia    Osteopenia    PONV (postoperative nausea and vomiting)     Past Surgical History:  Procedure Laterality Date   ABDOMINAL HYSTERECTOMY     BACK SURGERY     bladder tack     BREAST EXCISIONAL BIOPSY Bilateral    benign   BREAST SURGERY Bilateral    CERVICAL SPINE SURGERY     CHOLECYSTECTOMY N/A 03/24/2015   Procedure: LAPAROSCOPIC CHOLECYSTECTOMY;  Surgeon: Oneil Budge, MD;  Location: [facility];  Service: General;  Laterality: N/A;   DILATION AND CURETTAGE OF UTERUS     EXCISIONAL HEMORRHOIDECTOMY      LUMBAR LAMINECTOMY/DECOMPRESSION MICRODISCECTOMY N/A 09/09/2013   Procedure: LUMBAR LAMINECTOMY/DECOMPRESSION L4-5 MICRODISCECTOMY LEFT L4-5;  Surgeon: Ingle DELENA Heading, MD;  Location: [facility];  Service: Orthopedics;  Laterality: N/A;   NECK SURGERY     TEMPORAL ARTERY BIOPSY / LIGATION Right    TUBAL LIGATION      Prior to Admission medications   Medication Sig Start Date End Date Taking? Authorizing Provider  acetaminophen  (TYLENOL ) 500 MG tablet Take 1,000 mg by mouth every 6 (six) hours as needed for fever.    [provider]  albuterol  (PROVENTIL ) (2.5 MG/3ML) 0.083% nebulizer solution Take 3 mLs (2.5 mg total) by nebulization every 6 (six) hours as needed for wheezing or shortness of breath. 05/29/22   Chandra Harlene DELENA, NP  ALPRAZolam  (XANAX ) 0.25 MG tablet Take 0.25 mg by mouth daily as needed. 10/28/21   [provider]  benzonatate  (TESSALON ) 100 MG capsule Take 1 capsule (100 mg total) by mouth 3 (three) times daily as needed for cough. Do not take with alcohol or while driving or operating heavy machinery.  May cause drowsiness. 05/29/22   Chandra Harlene DELENA, NP  calcium-vitamin D (OSCAL WITH D) 500-5 MG-MCG tablet Take 1 tablet by mouth daily at 12 noon.    [provider]  citalopram (CELEXA) 10 MG tablet Take 10 mg by mouth daily.    [provider]  HYDROcodone -acetaminophen  (NORCO/VICODIN) 5-325 MG tablet Take 1 tablet by mouth. 12/17/15   [provider]  ondansetron  (ZOFRAN ) 4 MG tablet Take 1 tablet (4 mg total) by mouth every 8 (eight) hours as needed for nausea or vomiting. 01/31/24   Pyrtle, Gordy HERO, MD  polyethylene glycol (MIRALAX  / GLYCOLAX ) packet Take 17 g by mouth daily.    [provider]  triamcinolone  cream (KENALOG) 0.5 % Apply 1 application topically 3 (three) times daily. 03/22/15   [provider]    Current Outpatient Medications  Medication Sig Dispense Refill   acetaminophen  (TYLENOL ) 500 MG tablet  Take 1,000 mg by mouth every 6 (six) hours as needed for fever.     albuterol  (PROVENTIL ) (2.5 MG/3ML) 0.083% nebulizer solution Take 3 mLs (2.5 mg total) by nebulization every 6 (six) hours as needed for wheezing or shortness of breath. 75 mL 0   ALPRAZolam  (XANAX ) 0.25 MG tablet Take 0.25 mg by mouth daily as needed.     benzonatate  (TESSALON ) 100 MG capsule Take 1 capsule (100 mg total) by mouth 3 (three) times daily as needed for cough. Do not take with alcohol or while driving or operating heavy machinery.  May cause drowsiness. 21 capsule 0   calcium-vitamin D (OSCAL WITH D) 500-5 MG-MCG tablet Take 1 tablet by mouth daily at 12 noon.     citalopram (CELEXA) 10 MG tablet Take 10 mg by mouth daily.     HYDROcodone -acetaminophen  (NORCO/VICODIN) 5-325 MG tablet Take 1 tablet by mouth.     ondansetron  (ZOFRAN ) 4 MG tablet Take 1 tablet (4 mg total) by mouth every 8 (eight) hours as needed for nausea or vomiting. 2 tablet 0   polyethylene glycol (MIRALAX  / GLYCOLAX ) packet Take 17 g by mouth daily.     triamcinolone  cream (KENALOG) 0.5 % Apply 1 application topically 3 (three) times daily.     Current Facility-Administered Medications  Medication Dose Route Frequency Provider Last Rate Last Admin   0.9 %  sodium chloride  infusion  500 mL Intravenous Once Quindell Shere V, MD        Allergies as of 02/26/2024 - Review Complete 02/26/2024  Allergen Reaction Noted   Adhesive [tape] Rash 09/09/2013   Baclofen Other (See Comments) 03/10/2013   Buspar [buspirone] Anxiety 03/23/2015   Carbamazepine  Anxiety 03/23/2015   Ciprofloxacin Rash 10/10/2007   Contrast media [iodinated contrast media] Rash 03/23/2015   Iohexol Rash 12/06/2010   Latex Other (See Comments) 09/09/2013   Morphine  Nausea And Vomiting 01/31/2024   Morphine  and codeine Nausea And Vomiting 09/05/2013   Neosporin [neomycin -bacitracin  zn-polymyx] Rash 03/23/2015   Neosporin [neomycin -polymyxin-gramicidin] Other (See Comments)  03/23/2015   Nsaids Other (See Comments)    Oxycodone  Rash 03/23/2015   Relafen [nabumetone] Anxiety 03/23/2015   Robaxin  [methocarbamol ] Other (See Comments) 03/24/2015   Wound dressing adhesive Dermatitis 09/09/2013   Zoloft [sertraline hcl] Anxiety 03/23/2015    Family History  Problem Relation Age of Onset   Colon cancer Mother    Cancer - Colon Mother    Breast cancer Mother    Alzheimer's disease Mother    Diabetes Father    Colon polyps Sister    Heart disease Sister    Breast cancer Sister    Ovarian cancer Sister    Heart disease Sister    Scleroderma Sister    Heart disease Sister    Heart disease Sister    Hypertension Sister    Dementia Sister    Heart disease Brother  Stomach cancer Brother    Breast cancer Daughter    Esophageal cancer Son     Social History   Socioeconomic History   Marital status: Married    Spouse name: Christopher    Number of children: 4   Years of education: 8 th   Highest education level: Not on file  Occupational History   Occupation: retired    Comment: retired  Tobacco Use   Smoking status: Former    Current packs/day: 0.00    Types: Cigarettes    Quit date: 02/05/1970    Years since quitting: 54.0   Smokeless tobacco: Never  Vaping Use   Vaping status: Never Used  Substance and Sexual Activity   Alcohol use: No   Drug use: No   Sexual activity: Not Currently  Other Topics Concern   Not on file  Social History Narrative   Patient lives at home with her husband Astrid)   Retired.   Education -8 th   Right handed.   Caffeine- None   Social Drivers of Corporate Investment Banker Strain: Not on file  Food Insecurity: Not on file  Transportation Needs: Not on file  Physical Activity: Not on file  Stress: Not on file  Social Connections: Not on file  Intimate Partner Violence: Not on file    Review of Systems:  All other review of systems negative except as mentioned in the HPI.  Physical Exam: Vital signs  in last 24 hours: BP (!) 151/71   Pulse 84   Temp 98.1 F (36.7 C)   Ht 4' 9 (1.448 m)   Wt 95 lb (43.1 kg)   SpO2 97%   BMI 20.56 kg/m  General:   Alert, NAD Lungs:  Clear .   Heart:  Regular rate and rhythm Abdomen:  Soft, nontender and nondistended. Neuro/Psych:  Alert and cooperative. Normal mood and affect. A and O x 3  Reviewed labs, radiology imaging, old records and pertinent past GI work up  Patient is appropriate for planned procedure(s) and anesthesia in an ambulatory setting   K. Veena Demarqus Jocson , MD [phone]

## 2024-02-26 NOTE — Progress Notes (Signed)
 Pt's states no medical or surgical changes since previsit or office visit.

## 2024-02-27 ENCOUNTER — Other Ambulatory Visit: Payer: Self-pay

## 2024-02-27 ENCOUNTER — Telehealth: Payer: Self-pay

## 2024-02-27 DIAGNOSIS — K4090 Unilateral inguinal hernia, without obstruction or gangrene, not specified as recurrent: Secondary | ICD-10-CM

## 2024-02-27 DIAGNOSIS — R1030 Lower abdominal pain, unspecified: Secondary | ICD-10-CM

## 2024-02-27 NOTE — Telephone Encounter (Signed)
 Follow up call to pt, no answer.

## 2024-03-04 ENCOUNTER — Ambulatory Visit: Payer: Self-pay | Admitting: Gastroenterology

## 2024-03-04 LAB — SURGICAL PATHOLOGY

## 2024-03-18 DIAGNOSIS — F419 Anxiety disorder, unspecified: Secondary | ICD-10-CM | POA: Diagnosis not present

## 2024-03-18 DIAGNOSIS — I700 Atherosclerosis of aorta: Secondary | ICD-10-CM | POA: Diagnosis not present

## 2024-03-18 DIAGNOSIS — F324 Major depressive disorder, single episode, in partial remission: Secondary | ICD-10-CM | POA: Diagnosis not present

## 2024-03-18 DIAGNOSIS — K5900 Constipation, unspecified: Secondary | ICD-10-CM | POA: Diagnosis not present

## 2024-03-18 DIAGNOSIS — Z5181 Encounter for therapeutic drug level monitoring: Secondary | ICD-10-CM | POA: Diagnosis not present

## 2024-03-18 DIAGNOSIS — K4090 Unilateral inguinal hernia, without obstruction or gangrene, not specified as recurrent: Secondary | ICD-10-CM | POA: Diagnosis not present

## 2024-03-18 DIAGNOSIS — Z0000 Encounter for general adult medical examination without abnormal findings: Secondary | ICD-10-CM | POA: Diagnosis not present

## 2024-03-18 DIAGNOSIS — M810 Age-related osteoporosis without current pathological fracture: Secondary | ICD-10-CM | POA: Diagnosis not present

## 2024-04-17 ENCOUNTER — Ambulatory Visit
Admission: EM | Admit: 2024-04-17 | Discharge: 2024-04-17 | Disposition: A | Discharge status: Home / Self Care | Attending: Family Medicine | Admitting: Family Medicine

## 2024-04-17 ENCOUNTER — Encounter: Payer: Self-pay | Admitting: Emergency Medicine

## 2024-04-17 ENCOUNTER — Other Ambulatory Visit: Payer: Self-pay

## 2024-04-17 DIAGNOSIS — S39012A Strain of muscle, fascia and tendon of lower back, initial encounter: Secondary | ICD-10-CM | POA: Diagnosis not present

## 2024-04-17 MED ORDER — DEXAMETHASONE SOD PHOSPHATE PF 10 MG/ML IJ SOLN
10.0000 mg | Freq: Once | INTRAMUSCULAR | Status: AC
  Administered 2024-04-17: 10 mg via INTRAMUSCULAR

## 2024-04-17 NOTE — ED Provider Notes (Signed)
 " [facility]    CSN: [id number] Arrival date & time: 04/17/24  1301      History   Chief Complaint Chief Complaint  Patient presents with   Back Pain    HPI Valerie Hampton is a 84 y.o. female.   Patient presenting today with 1 week history of right low back pain that seem to start after getting out of bed last Thursday.  She states if she moves wrong the pain worsens, improved at rest.  Denies weakness, numbness, tingling, bowel or bladder incontinence, saddle anesthesias, fevers, new urinary symptoms.  She does have a history of chronic back issues with a back surgery back in 2017 for a herniated disc.  States she has chronic pain from this but this is new beyond her typical pain.  Trying Tylenol  with only mild temporary benefit.    Past Medical History:  Diagnosis Date   Allergic rhinitis    Anxiety    Arthritis    Candida esophagitis (HCC)    Cataract    Chronic back pain    Depression    Family history of anesthesia complication     sister went out ?     Fibromyalgia    Gallstones    GERD (gastroesophageal reflux disease)    Headache(784.0)    due ot facial nerve condition    Hiatal hernia    Intraductal papilloma 2007   no evidence of malignancy   Occipital neuralgia    Osteopenia    PONV (postoperative nausea and vomiting)     Patient Active Problem List   Diagnosis Date Noted   Biliary colic 03/23/2015   Cholelithiasis 03/23/2015   Acute calculous cholecystitis 03/23/2015   Spinal stenosis, lumbar region, with neurogenic claudication 09/09/2013   Herniated lumbar intervertebral disc 09/09/2013   Spinal stenosis of lumbar region 09/06/2013   Right facial pain 03/11/2013   CARCINOMA IN SITU OF BREAST 10/10/2007   ESOPHAGEAL STRICTURE 10/10/2007   CONSTIPATION 10/10/2007   IRRITABLE BOWEL SYNDROME 10/10/2007   CERVICAL DISC DISORDER 10/10/2007   CANDIDIASIS, ESOPHAGEAL 06/13/2007   EROSIVE ESOPHAGITIS 06/13/2007   ESOPHAGEAL MOTILITY  DISORDER 06/13/2007   GERD 06/13/2007   FIBROMYALGIA 06/13/2007    Past Surgical History:  Procedure Laterality Date   ABDOMINAL HYSTERECTOMY     BACK SURGERY     bladder tack     BREAST EXCISIONAL BIOPSY Bilateral    benign   BREAST SURGERY Bilateral    CERVICAL SPINE SURGERY     CHOLECYSTECTOMY N/A 03/24/2015   Procedure: LAPAROSCOPIC CHOLECYSTECTOMY;  Surgeon: Oneil Budge, MD;  Location: [facility];  Service: General;  Laterality: N/A;   DILATION AND CURETTAGE OF UTERUS     EXCISIONAL HEMORRHOIDECTOMY     LUMBAR LAMINECTOMY/DECOMPRESSION MICRODISCECTOMY N/A 09/09/2013   Procedure: LUMBAR LAMINECTOMY/DECOMPRESSION L4-5 MICRODISCECTOMY LEFT L4-5;  Surgeon: Tanda DELENA Heading, MD;  Location: [facility];  Service: Orthopedics;  Laterality: N/A;   NECK SURGERY     TEMPORAL ARTERY BIOPSY / LIGATION Right    TUBAL LIGATION      OB History     Gravida  7   Para  4   Term  4   Preterm      AB  3   Living  3      SAB  3   IAB      Ectopic      Multiple      Live Births  Home Medications    Prior to Admission medications  Medication Sig Start Date End Date Taking? Authorizing Provider  acetaminophen  (TYLENOL ) 500 MG tablet Take 1,000 mg by mouth every 6 (six) hours as needed for fever.    [provider]  albuterol  (PROVENTIL ) (2.5 MG/3ML) 0.083% nebulizer solution Take 3 mLs (2.5 mg total) by nebulization every 6 (six) hours as needed for wheezing or shortness of breath. 05/29/22   Chandra Harlene LABOR, NP  ALPRAZolam  (XANAX ) 0.25 MG tablet Take 0.25 mg by mouth daily as needed. 10/28/21   [provider]  benzonatate  (TESSALON ) 100 MG capsule Take 1 capsule (100 mg total) by mouth 3 (three) times daily as needed for cough. Do not take with alcohol or while driving or operating heavy machinery.  May cause drowsiness. 05/29/22   Chandra Harlene LABOR, NP  calcium-vitamin D (OSCAL WITH D) 500-5 MG-MCG tablet Take 1 tablet by mouth daily at 12  noon.    [provider]  citalopram (CELEXA) 10 MG tablet Take 10 mg by mouth daily.    [provider]  HYDROcodone -acetaminophen  (NORCO/VICODIN) 5-325 MG tablet Take 1 tablet by mouth. 12/17/15   [provider]  ondansetron  (ZOFRAN ) 4 MG tablet Take 1 tablet (4 mg total) by mouth every 8 (eight) hours as needed for nausea or vomiting. 01/31/24   Pyrtle, Gordy HERO, MD  polyethylene glycol (MIRALAX  / GLYCOLAX ) packet Take 17 g by mouth daily.    [provider]  triamcinolone  cream (KENALOG) 0.5 % Apply 1 application topically 3 (three) times daily. 03/22/15   [provider]    Family History Family History  Problem Relation Age of Onset   Rectal cancer Mother    Colon cancer Mother    Cancer - Colon Mother    Breast cancer Mother    Alzheimer's disease Mother    Diabetes Father    Colon polyps Sister    Heart disease Sister    Breast cancer Sister    Ovarian cancer Sister    Heart disease Sister    Scleroderma Sister    Heart disease Sister    Heart disease Sister    Hypertension Sister    Dementia Sister    Heart disease Brother    Stomach cancer Brother    Breast cancer Daughter    Esophageal cancer Son     Social History Social History[1]   Allergies   Adhesive [tape], Baclofen, Buspar [buspirone], Carbamazepine , Ciprofloxacin, Contrast media [iodinated contrast media], Iohexol, Latex, Morphine , Morphine  and codeine, Neosporin [neomycin -bacitracin  zn-polymyx], Neosporin [neomycin -polymyxin-gramicidin], Nsaids, Oxycodone , Relafen [nabumetone], Robaxin  [methocarbamol ], Wound dressing adhesive, and Zoloft [sertraline hcl]   Review of Systems Review of Systems Per HPI  Physical Exam Triage Vital Signs ED Triage Vitals  Encounter Vitals Group     BP 04/17/24 1314 136/80     Girls Systolic BP Percentile --      Girls Diastolic BP Percentile --      Boys Systolic BP Percentile --      Boys Diastolic BP Percentile --       Pulse Rate 04/17/24 1314 80     Resp 04/17/24 1314 20     Temp 04/17/24 1314 98.2 F (36.8 C)     Temp Source 04/17/24 1314 Oral     SpO2 04/17/24 1314 97 %     Weight --      Height --      Head Circumference --      Peak Flow --  Pain Score 04/17/24 1310 6     Pain Loc --      Pain Education --      Exclude from Growth Chart --    No data found.  Updated Vital Signs BP 136/80 (BP Location: Right Arm)   Pulse 80   Temp 98.2 F (36.8 C) (Oral)   Resp 20   SpO2 97%   Visual Acuity Right Eye Distance:   Left Eye Distance:   Bilateral Distance:    Right Eye Near:   Left Eye Near:    Bilateral Near:     Physical Exam Vitals and nursing note reviewed.  Constitutional:      Appearance: Normal appearance. She is not ill-appearing.  HENT:     Head: Atraumatic.  Eyes:     Extraocular Movements: Extraocular movements intact.     Conjunctiva/sclera: Conjunctivae normal.  Cardiovascular:     Rate and Rhythm: Normal rate and regular rhythm.     Heart sounds: Normal heart sounds.  Pulmonary:     Effort: Pulmonary effort is normal.     Breath sounds: Normal breath sounds.  Musculoskeletal:        General: Tenderness present. No swelling. Normal range of motion.     Cervical back: Normal range of motion and neck supple.     Comments: No midline spinal tenderness to palpation diffusely.  Negative straight leg raise bilateral lower extremities.  Normal gait and range of motion.  Localized tenderness to palpation to the right lumbar musculature  Skin:    General: Skin is warm and dry.  Neurological:     Mental Status: She is alert and oriented to person, place, and time.     Comments: Bilateral lower extremities neurovascularly intact  Psychiatric:        Mood and Affect: Mood normal.        Thought Content: Thought content normal.        Judgment: Judgment normal.      UC Treatments / Results  Labs (all labs ordered are listed, but only abnormal results are  displayed) Labs Reviewed - No data to display  EKG   Radiology No results found.  Procedures Procedures (including critical care time)  Medications Ordered in UC Medications  dexamethasone  (DECADRON ) injection 10 mg (10 mg Intramuscular Given 04/17/24 1353)    Initial Impression / Assessment and Plan / UC Course  I have reviewed the triage vital signs and the nursing notes.  Pertinent labs & imaging results that were available during my care of the patient were reviewed by me and considered in my medical decision making (see chart for details).     Suspect lumbar strain.  No red flag findings on exam.  Has follow-up with back specialist next week, in meantime we will treat with IM Decadron , heat, massage, Tylenol .  Follow-up for worsening or unresolving symptoms.  Final Clinical Impressions(s) / UC Diagnoses   Final diagnoses:  Strain of lumbar region, initial encounter     Discharge Instructions      We have given you a steroid shot today for pain and inflammation in your back.  In addition you may continue Tylenol , heat, muscle rubs, lidocaine  patches as needed.  Follow-up with orthopedics if worsening or not resolving    ED Prescriptions   None    PDMP not reviewed this encounter.    [1]  Social History Tobacco Use   Smoking status: Former    Current packs/day: 0.00    Types: Cigarettes  Quit date: 02/05/1970    Years since quitting: 54.2   Smokeless tobacco: Never  Vaping Use   Vaping status: Never Used  Substance Use Topics   Alcohol use: No   Drug use: No     Stuart Vernell Norris, PA-C 04/17/24 1814  "

## 2024-04-17 NOTE — ED Triage Notes (Signed)
 Pt reports lower back pain since getting out of bed last Thursday. Pt reports is supposed to see back specialist next week. Pt reports pain has increased in severity with each day. Reports back surgery in 2007. Denies any known injury.

## 2024-04-17 NOTE — Discharge Instructions (Signed)
 We have given you a steroid shot today for pain and inflammation in your back.  In addition you may continue Tylenol , heat, muscle rubs, lidocaine  patches as needed.  Follow-up with orthopedics if worsening or not resolving

## 2024-04-21 ENCOUNTER — Ambulatory Visit: Admitting: Gastroenterology

## 2024-06-16 ENCOUNTER — Ambulatory Visit

## 2024-06-23 ENCOUNTER — Other Ambulatory Visit (HOSPITAL_BASED_OUTPATIENT_CLINIC_OR_DEPARTMENT_OTHER)

## 2024-07-02 ENCOUNTER — Ambulatory Visit (HOSPITAL_COMMUNITY): Admit: 2024-07-02 | Admitting: Surgery
